# Patient Record
Sex: Male | Born: 1955 | Race: White | Hispanic: No | Marital: Married | Smoking: Former smoker
Health system: Southern US, Community
[De-identification: ages and names within clinical notes are randomized; demographics above are authoritative.]

## PROBLEM LIST (undated history)

## (undated) DIAGNOSIS — I219 Acute myocardial infarction, unspecified: Secondary | ICD-10-CM

## (undated) DIAGNOSIS — I10 Essential (primary) hypertension: Secondary | ICD-10-CM

## (undated) DIAGNOSIS — E119 Type 2 diabetes mellitus without complications: Secondary | ICD-10-CM

## (undated) DIAGNOSIS — I509 Heart failure, unspecified: Secondary | ICD-10-CM

## (undated) HISTORY — PX: CARDIAC SURGERY: SHX584

---

## 2004-05-20 ENCOUNTER — Emergency Department: Payer: Self-pay | Admitting: Emergency Medicine

## 2004-07-03 ENCOUNTER — Emergency Department: Payer: Self-pay | Admitting: Emergency Medicine

## 2004-08-03 ENCOUNTER — Ambulatory Visit (HOSPITAL_COMMUNITY): Admission: RE | Admit: 2004-08-03 | Discharge: 2004-08-03 | Payer: Self-pay | Admitting: Orthopedic Surgery

## 2006-03-30 IMAGING — CR DG CHEST 2V
2 series · 2 of 2 positions shown · non-contrast
Comparison: No comparisons.

CLINICAL DATA: 48-year-old male, left thumb infection.  History of smoking and hypertension.  Preoperative respiratory evaluation.  
 2 VIEW CHEST RADIOGRAPH:

[view not recorded (1 of 2)]
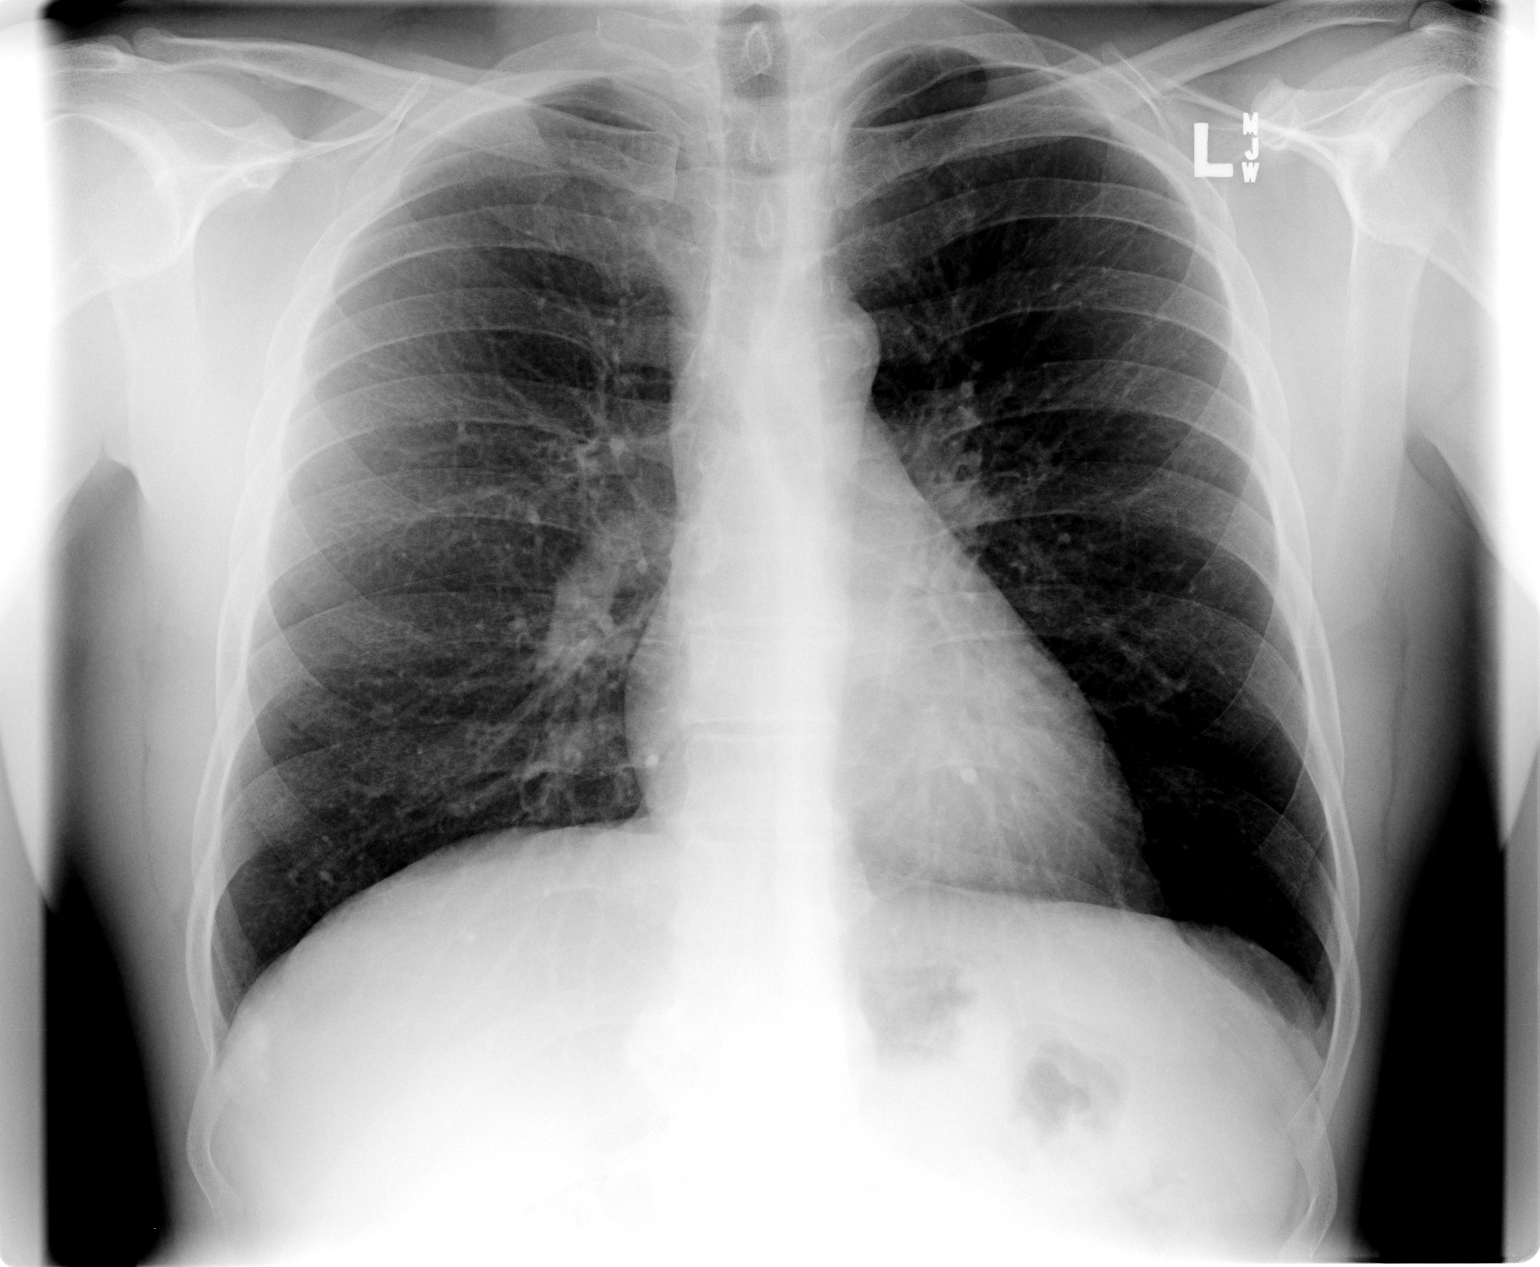

[view not recorded (2 of 2)]
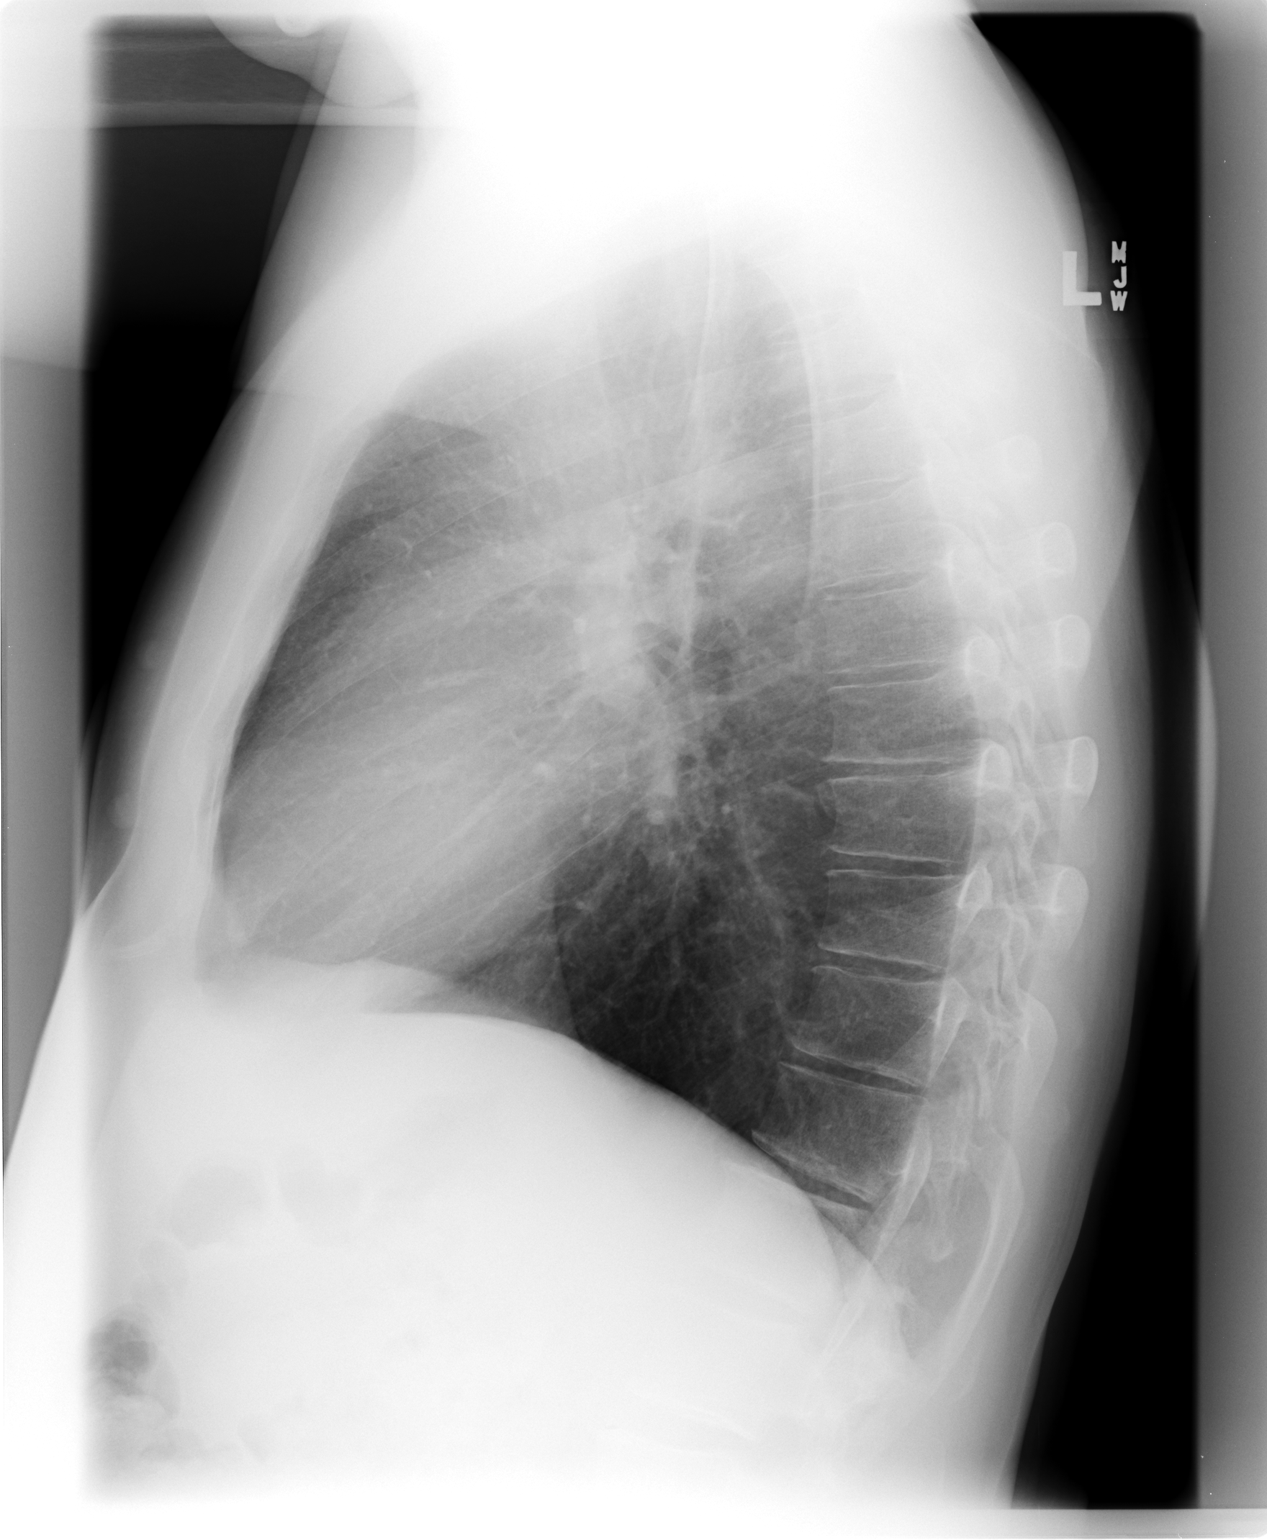

[2 of 2 positions shown; findings below may reference images not displayed]

FINDINGS: Mild increased peribronchial changes and interstitial markings throughout the lungs without acute consolidation, pneumonia, effusion, or pneumothorax.  Heart size is normal.
IMPRESSION: Chronic peribronchial changes and interstitial prominence.  No acute chest disease.

## 2010-07-24 DIAGNOSIS — I10 Essential (primary) hypertension: Secondary | ICD-10-CM | POA: Insufficient documentation

## 2010-07-24 DIAGNOSIS — G250 Essential tremor: Secondary | ICD-10-CM | POA: Insufficient documentation

## 2010-10-01 ENCOUNTER — Inpatient Hospital Stay: Payer: Self-pay | Admitting: Internal Medicine

## 2010-10-10 ENCOUNTER — Ambulatory Visit: Payer: Self-pay | Admitting: Internal Medicine

## 2010-10-10 ENCOUNTER — Observation Stay: Payer: Self-pay | Admitting: Internal Medicine

## 2010-10-21 ENCOUNTER — Ambulatory Visit: Payer: Self-pay | Admitting: Internal Medicine

## 2010-11-13 ENCOUNTER — Emergency Department: Payer: Self-pay | Admitting: Emergency Medicine

## 2011-01-09 ENCOUNTER — Other Ambulatory Visit: Payer: Self-pay | Admitting: Internal Medicine

## 2012-06-06 IMAGING — CR DG CHEST 1V PORT
1 series · 1 of 1 positions shown · non-contrast
Comparison: none

REASON FOR EXAM: Shortness of Breath
COMMENTS:

[view not recorded]
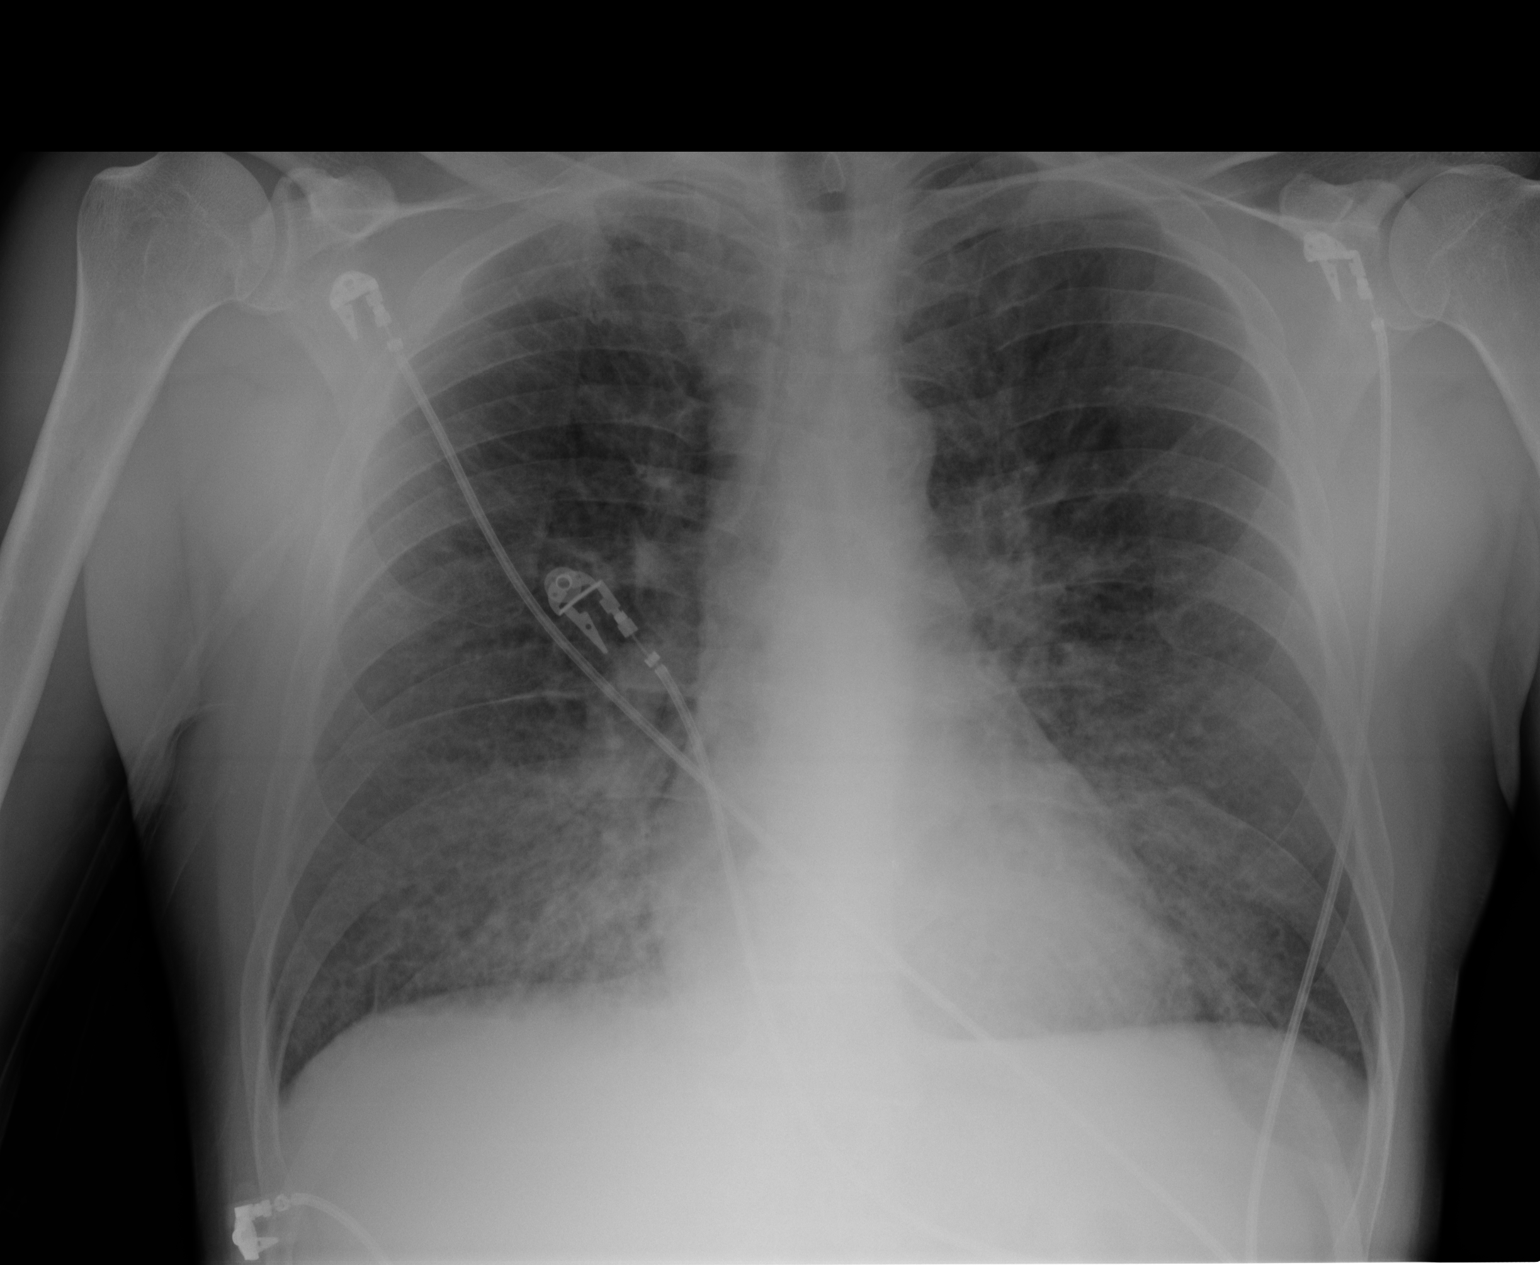

[1 of 1 positions shown; findings below may reference images not displayed]

PROCEDURE:     DXR - DXR PORTABLE CHEST SINGLE VIEW  - October 10, 2010 [DATE]

RESULT:     Is patchy increased density in the mid and lower lung zones
bilaterally suspicious for bilateral infiltrates versus edema. The heart is
normal in size. There is no effusion or complete consolidation. Monitoring
electrodes are present.
IMPRESSION: Patchy basilar and midlung infiltrates versus pulmonary
edema. Followup PA and lateral views would be helpful.

## 2013-05-09 ENCOUNTER — Inpatient Hospital Stay: Payer: Self-pay | Admitting: Internal Medicine

## 2013-05-09 LAB — MAGNESIUM: Magnesium: 1.6 mg/dL — ABNORMAL LOW

## 2013-05-09 LAB — COMPREHENSIVE METABOLIC PANEL
ALBUMIN: 3.1 g/dL — AB (ref 3.4–5.0)
ALT: 28 U/L (ref 12–78)
ANION GAP: 5 — AB (ref 7–16)
AST: 29 U/L (ref 15–37)
Alkaline Phosphatase: 165 U/L — ABNORMAL HIGH
BUN: 15 mg/dL (ref 7–18)
Bilirubin,Total: 0.5 mg/dL (ref 0.2–1.0)
CREATININE: 1.35 mg/dL — AB (ref 0.60–1.30)
Calcium, Total: 8.5 mg/dL (ref 8.5–10.1)
Chloride: 104 mmol/L (ref 98–107)
Co2: 27 mmol/L (ref 21–32)
EGFR (African American): >60
EGFR (Non-African Amer.): 57 — ABNORMAL LOW
GLUCOSE: 239 mg/dL — AB (ref 65–99)
OSMOLALITY: 281 (ref 275–301)
POTASSIUM: 4.2 mmol/L (ref 3.5–5.1)
SODIUM: 136 mmol/L (ref 136–145)
Total Protein: 7.7 g/dL (ref 6.4–8.2)

## 2013-05-09 LAB — CBC
HCT: 43 % (ref 40.0–52.0)
HGB: 13.4 g/dL (ref 13.0–18.0)
MCH: 26 pg (ref 26.0–34.0)
MCHC: 31.3 g/dL — ABNORMAL LOW (ref 32.0–36.0)
MCV: 83 fL (ref 80–100)
PLATELETS: 507 10*3/uL — AB (ref 150–440)
RBC: 5.17 10*6/uL (ref 4.40–5.90)
RDW: 16.9 % — ABNORMAL HIGH (ref 11.5–14.5)
WBC: 13.9 10*3/uL — ABNORMAL HIGH (ref 3.8–10.6)

## 2013-05-09 LAB — PRO B NATRIURETIC PEPTIDE: B-Type Natriuretic Peptide: 1034 pg/mL — ABNORMAL HIGH (ref 0–125)

## 2013-05-09 LAB — CK TOTAL AND CKMB (NOT AT ARMC)
CK, TOTAL: 195 U/L (ref 35–232)
CK-MB: 3.3 ng/mL (ref 0.5–3.6)

## 2013-05-09 LAB — TROPONIN I: Troponin-I: <0.02 ng/mL

## 2013-05-09 LAB — APTT: ACTIVATED PTT: 23 s — AB (ref 23.6–35.9)

## 2013-05-09 LAB — PROTIME-INR
INR: 1
PROTHROMBIN TIME: 13.4 s (ref 11.5–14.7)

## 2013-05-09 LAB — LIPASE, BLOOD: Lipase: 187 U/L (ref 73–393)

## 2013-05-10 LAB — TROPONIN I
TROPONIN-I: 0.13 ng/mL — AB
Troponin-I: 0.11 ng/mL — ABNORMAL HIGH

## 2013-05-10 LAB — CK-MB
CK-MB: 3.5 ng/mL (ref 0.5–3.6)
CK-MB: 3.9 ng/mL — AB (ref 0.5–3.6)

## 2013-05-11 LAB — COMPREHENSIVE METABOLIC PANEL
ALT: 25 U/L (ref 12–78)
Albumin: 3 g/dL — ABNORMAL LOW (ref 3.4–5.0)
Alkaline Phosphatase: 97 U/L
Anion Gap: 18 — ABNORMAL HIGH (ref 7–16)
BILIRUBIN TOTAL: 0.5 mg/dL (ref 0.2–1.0)
BUN: 26 mg/dL — ABNORMAL HIGH (ref 7–18)
CALCIUM: 8.8 mg/dL (ref 8.5–10.1)
Chloride: 101 mmol/L (ref 98–107)
Co2: 17 mmol/L — ABNORMAL LOW (ref 21–32)
Creatinine: 1.13 mg/dL (ref 0.60–1.30)
Glucose: 127 mg/dL — ABNORMAL HIGH (ref 65–99)
OSMOLALITY: 278 (ref 275–301)
POTASSIUM: 3.4 mmol/L — AB (ref 3.5–5.1)
SGOT(AST): 32 U/L (ref 15–37)
SODIUM: 136 mmol/L (ref 136–145)
TOTAL PROTEIN: 6.7 g/dL (ref 6.4–8.2)

## 2013-05-11 LAB — CBC WITH DIFFERENTIAL/PLATELET
BASOS ABS: 0 10*3/uL (ref 0.0–0.1)
BASOS PCT: 0.2 %
EOS PCT: 0 %
Eosinophil #: 0 10*3/uL (ref 0.0–0.7)
HCT: 34.5 % — ABNORMAL LOW (ref 40.0–52.0)
HGB: 11.1 g/dL — ABNORMAL LOW (ref 13.0–18.0)
Lymphocyte #: 2.1 10*3/uL (ref 1.0–3.6)
Lymphocyte %: 15.4 %
MCH: 25.8 pg — AB (ref 26.0–34.0)
MCHC: 32.3 g/dL (ref 32.0–36.0)
MCV: 80 fL (ref 80–100)
Monocyte #: 0.9 x10 3/mm (ref 0.2–1.0)
Monocyte %: 6.6 %
NEUTROS PCT: 77.8 %
Neutrophil #: 10.7 10*3/uL — ABNORMAL HIGH (ref 1.4–6.5)
PLATELETS: 284 10*3/uL (ref 150–440)
RBC: 4.31 10*6/uL — ABNORMAL LOW (ref 4.40–5.90)
RDW: 16.6 % — ABNORMAL HIGH (ref 11.5–14.5)
WBC: 13.8 10*3/uL — AB (ref 3.8–10.6)

## 2013-05-11 LAB — LIPID PANEL
Cholesterol: 138 mg/dL (ref 0–200)
HDL Cholesterol: 46 mg/dL (ref 40–60)
Ldl Cholesterol, Calc: 77 mg/dL (ref 0–100)
Triglycerides: 73 mg/dL (ref 0–200)
VLDL Cholesterol, Calc: 15 mg/dL (ref 5–40)

## 2013-05-14 LAB — CULTURE, BLOOD (SINGLE)

## 2014-05-10 DIAGNOSIS — E119 Type 2 diabetes mellitus without complications: Secondary | ICD-10-CM | POA: Insufficient documentation

## 2014-07-29 NOTE — Consult Note (Signed)
PATIENT NAME:  Richard [name], Richard [name] MR#:  [id number] DATE OF BIRTH:  [birth year]  DATE OF CONSULTATION:  05/10/2013  REFERRING PHYSICIAN:   CONSULTING PHYSICIAN:  Laurier NancyShaukat A. Lavone Barrientes, MD  INDICATION FOR CONSULTATION: Congestive heart failure and shortness of breath.   HISTORY OF PRESENT ILLNESS: This is a 59 year old white male with a past medical history of coronary artery disease, status post PCI and stenting twice when he had MI in June 2012, first at [facility] and then [facility]. He presented at that time also with ejection fraction of 45% and had congestive heart failure. Right now, he was doing fine until a couple of weeks ago, he started feeling short of breath. He thought he had flulike symptoms, but yesterday at 8:00 all of a sudden he had severe shortness of breath with no chest pain and thus was brought to the Emergency Room. He was also producing a productive cough with yellow sputum.   PAST MEDICAL HISTORY: History of coronary artery disease, hypertension, history of hernia repair, history of diet-controlled diabetes, hyperlipidemia and renal insufficiency.   MEDICATIONS: Aspirin 81 mg, Coreg 12.5 b.i.d., lisinopril 2.5, Effient 10 mg. omeprazole 20 mg, Lipitor 80 mg and Lasix 20 mg.   FAMILY HISTORY: Positive for coronary artery disease.   SOCIAL HISTORY: He quit smoking in 2012.   ALLERGIES: None.   PHYSICAL EXAMINATION:  GENERAL: He is alert, oriented x 3, in no acute distress.  VITAL SIGNS: Stable.  NECK: No JVD.  LUNGS: Clear.  HEART: Regular rate and rhythm. Normal S1, S2. No audible murmur.  ABDOMEN: Soft, nontender, positive bowel sounds.  EXTREMITIES: No pedal edema.   EKG showed sinus rhythm, left atrial enlargement, nonspecific ST-T changes. His BNP was 1034. BUN and creatinine are 15/1.35. Troponin first one was negative, but the second one came back 0.13. Chest x-ray shows pulmonary edema versus changes secondary to multifocal pneumonia.    ASSESSMENT AND PLAN: The patient has congestive heart failure, mildly elevated troponin with history of coronary artery disease. He is being treated for pneumonia as well as congestive heart failure. Advise repeating an echocardiogram and agree with current treatment of giving Lasix 40 b.i.d. intravenous and continue the rest of his medications along with heparin. We will make further recommendation after looking at the echocardiogram.   Thank you very much for the referral.  ____________________________ Laurier NancyShaukat A. Kailana Benninger, MD sak:aw D: 05/10/2013 08:37:02 ET T: 05/10/2013 08:48:30 ET JOB#: [id number]  cc: Laurier NancyShaukat A. Taria Castrillo, MD, <Dictator> Laurier NancySHAUKAT A Torry Istre MD ELECTRONICALLY SIGNED 05/20/2013 8:38

## 2014-07-29 NOTE — H&P (Signed)
PATIENT NAME:  Richard [name], Richard [name] MR#:  [id number] DATE OF BIRTH:  Jun 13, 1955  DATE OF ADMISSION:  05/10/2013  REFERRING PHYSICIAN: Dr. Hinda Kehr.   PRIMARY CARE PHYSICIAN AND PRIMARY CARDIOLOGIST: The patient is following at [facility] for both primary care and cardiology.   CHIEF COMPLAINT: Shortness of breath.   HISTORY OF PRESENT ILLNESS: This is a 59 year old male with significant past medical history of coronary artery disease, status post stent in June 2012 here and stent in late 2012 at [facility], as well as history of congestive heart failure with last echo in our records showing EF 45% before 2 years. The patient presents with complaints of shortness of breath for the last few days. Reports his symptoms mainly exacerbated by exertion but have been worsening recently, even started to happen at rest. He denies any lower extremity edema or any supine orthopnea. As well, denies any chest pain, any nausea, coffee-ground emesis, any palpitation or dizziness. Upon presentation, the patient was tachypneic and in acute respiratory distress requiring BiPAP. Given Imdur. The patient's chest x-ray did show extensive bilateral airspace disease most compatible with pulmonary edema, although it can be secondary to multifocal pneumonia. The patient denies any fever or chills. He has been complaining of cough with productive yellow sputum as well. As well, he has been complaining of feeling of having runny nose and having viral URI disease over the last week. Hospitalist service was requested to admit the patient for further management and treatment of his CHF and pneumonia.   PAST MEDICAL HISTORY:  1. History of coronary artery disease.  2. Hypertension.  3. History of smoking, quit in 2012.  4. History of hernia repair.  5. Impaired fasting glucose.  6. Hyperlipidemia.  7. Renal insufficiency.   HOME MEDICATIONS:  1. Aspirin 81 mg daily.  2. Coreg 12.5 mg p.o. b.i.d.  3. Lisinopril 2.5 mg oral daily.  4.  Effient 10 mg oral daily.  5. Omeprazole 20 mg oral daily.  6. Atorvastatin 80 mg oral daily.  7. Sublingual nitro as needed.  8. Lasix 20 mg oral daily.   FAMILY HISTORY: Significant for cancer in his mother and coronary artery disease in father.   SOCIAL HISTORY: The patient lives with his wife. Has 3 children. The patient quit smoking in 2012. No alcohol. No illicit drug use.   ALLERGIES: No known drug allergies.   REVIEW OF SYSTEMS:  CONSTITUTIONAL: The patient denies fever, chills. Complains of fatigue, weakness. Denies weight gain, weight loss.  EYES: Denies blurry vision, double vision, inflammation, glaucoma.  ENT: Complaints of upper respiratory infection symptoms including runny nose. Denies tinnitus, ear pain, hearing loss.  RESPIRATORY: Complains of cough productive of yellow sputum, shortness of breath. Denies history of COPD or hemoptysis.  CARDIOVASCULAR: Denies chest pain, edema, arrhythmia, palpitation, syncope.  GASTROINTESTINAL: Denies nausea, vomiting, diarrhea, abdominal pain, hematemesis.  GENITOURINARY: Denies dysuria, hematuria, renal colic.  ENDOCRINE: Denies polyuria, polydipsia, heat or cold intolerance.  HEMATOLOGY: Denies anemia, easy bruising, bleeding diathesis.  INTEGUMENTARY: Denies acne, rash or skin lesions.  MUSCULOSKELETAL: Denies any swelling, arthritis or cramps.  NEUROLOGIC: Denies CVA, TIA, tremors, vertigo.  PSYCHIATRIC: Denies anxiety, insomnia, bipolar disorder.   PHYSICAL EXAMINATION:  VITAL SIGNS: Temperature 97.9, pulse 103, respiratory rate 26, blood pressure 96/52, saturating 99% on BiPAP.  GENERAL: Well-nourished male, looks comfortable in bed, in no apparent distress on BiPAP.  HEENT: Head atraumatic, normocephalic. Pupils equal, reactive to light. Pink conjunctivae. Anicteric sclerae. Moist oral mucosa. Wearing BiPAP mask.  NECK: Has JVD + 8 cm. No carotid bruits. Supple. No thyromegaly.  CHEST: The patient has fair air entry  bilaterally with scattered rales and rhonchi. No wheezing.  CARDIOVASCULAR: S1, S2 heard. No rubs, murmurs or gallops.  ABDOMEN: Soft, nontender, nondistended. Bowel sounds present.  EXTREMITIES: No edema. No clubbing. No cyanosis. Pedal pulses +2 bilaterally.  PSYCHIATRIC: Appropriate affect. Awake, alert x 3. Intact judgment and insight.  NEUROLOGIC: Cranial nerves grossly intact. Motor 5 out of 5. No focal deficits.  LYMPHATIC: No cervical lymphadenopathy.  MUSCULOSKELETAL: No joint effusion or erythema.  SKIN: Warm and dry. Normal skin turgor.   PERTINENT LABORATORIES: BNP 1034, glucose 239, BUN 15, creatinine 1.35, sodium 136, potassium 4.2, chloride 104, CO2 27. ALT 28, AST 29, alk phos 165. Troponin less than 0.02. White blood cells 13.9, hemoglobin 13.4, hematocrit 43, platelets 507. INR is 1. PH 7.23, pCO2 of 52 and pO2 of 292.   IMAGING: Chest x-ray, portable, shows extensive bilateral airspace disease. Appears most compatible with pulmonary edema, although it could be secondary to multifocal pneumonia.   ASSESSMENT AND PLAN:  1. Acute congestive heart failure: The patient will be admitted to telemetry unit for his congestive heart failure. Will check 2-D echocardiogram to evaluate his ejection fraction. Appears to be most likely systolic congestive heart failure. Last known ejection fraction 45%. The patient will be started on IV Lasix 40 mg every 12 hours. Will give him first dose now. Will continue to cycle his cardiac enzymes and follow the trend. Will have daily weights and ins and outs. Will consult cardiology. He is already on aspirin, statin, lisinopril and beta blockers.  2. Pneumonia: Chest x-ray shows possible evidence of pneumonia. As well, with the patient's symptoms of cough with productive yellow sputum, he will start to be treated for community-acquired pneumonia. Will start him on Rocephin and Zithromax.  3. Hypertension: Blood pressure is acceptable, actually on the lower  side. This is after he received nitro paste and he is on BiPAP. Will resume him back on his home medications including Coreg and low-dose lisinopril.  4. Hyperlipidemia: Continue with statin.  5. History of coronary artery disease: Denies any chest pain. Has no EKG changes. Already on aspirin, Effient, statin and lisinopril.  6. Deep vein thrombosis prophylaxis: Will have the patient on subcutaneous heparin.  7. Code status: The patient reports he is FULL CODE.    TOTAL TIME SPENT ON ADMISSION AND PATIENT CARE: 55 minutes.   ____________________________ Albertine Patricia, MD dse:gb D: 05/10/2013 00:24:43 ET T: 05/10/2013 05:38:37 ET JOB#: [id number]  cc: Albertine Patricia, MD, <Dictator> Zebulen Simonis Graciela Husbands MD ELECTRONICALLY SIGNED 05/11/2013 3:54

## 2014-07-29 NOTE — Discharge Summary (Signed)
PATIENT NAME:  Richard [name], Richard [name] MR#:  [name] DATE OF BIRTH:  18-Apr-1955  DATE OF ADMISSION:  05/09/2013 DATE OF DISCHARGE:  05/11/2013  ADMITTING PHYSICIAN: Starleen Armsawood S. Elgergawy, MD  DISCHARGING PHYSICIAN: Enid Baasadhika Lysander Calixte, MD  [facility] At [facility].   PRIMARY CARDIOLOGIST: At [facility] as well.  CONSULTATIONS IN THE HOSPITAL: Cardiology consultation by Dr. Adrian BlackwaterShaukat Khan.   DISCHARGE DIAGNOSES: 1.  Acute  respiratory failure.  2.  Acute on chronic congestive heart failure, both systolic and diastolic dysfunction, ejection fraction around 45%.  3.  Coronary artery disease, status post left anterior descending stent.  4.  Elevated troponin secondary to demand ischemia.  5.  Acute bronchitis.   DISCHARGE HOME MEDICATIONS:  1.  Sublingual nitroglycerin 0.4 mg p.r.n. for chest pain.  2.  Lisinopril 5 mg p.o. daily.  3.  Aspirin 81 mg p.o. daily. 4.  Effient 10 mg p.o. daily.  5.  Coreg 12.5 mg p.o. b.i.d.  6.  Lipitor 80 mg p.o. at bedtime.  7.  Lasix 40 mg p.o. daily.  8.  Combivent Respimat 1 puff 4 times a day.  9.  Levaquin 500 mg p.o. daily for 3 more days.  10.  Potassium chloride 20 mEq p.o. daily.   DISCHARGE DIET: Low-sodium diet.   DISCHARGE ACTIVITY: As tolerated.   DISCHARGE FOLLOWUP INSTRUCTIONS:  1.  [facility].  2.  PCP followup in 2 weeks.   LABORATORY AND IMAGING STUDIES PRIOR TO DISCHARGE: WBC 13.8, hemoglobin 11.1, hematocrit 34.5, platelet count 284.   Sodium 136, potassium 3.4, chloride 101, bicarbonate 17, BUN 26, creatinine 1.1, glucose 127 and calcium of 8.8.   ALT 25, AST 32, alkaline phosphatase 97, total bilirubin 0.5 and albumin of 3.0.   LDL 77, HDL 46, total cholesterol 295158 and triglycerides 73.   Echo Doppler showing LV ejection fraction is 40% to 45%, impaired relaxation; diffuse hypokinesis is also noted.  Chest x-ray after admission showing resolved pulmonary edema with clear lung fields, and  minor opacities in the posterior lung bases could be atelectasis.   Troponins were borderline elevated at 0.11 and 0.13.   His initial ABG on admission showing pH of 7.23, pCO2 of 52, Po2 was 292 with bicarbonate of 22 and 98% sats on 100% FiO2 on the BiPAP.   Blood cultures are negative.    BRIEF HOSPITAL COURSE: Richard [name] is a pleasant 59 year old Caucasian male with past medical history significant for systolic CHF, last known EF of around 45%, coronary artery disease, status post LAD stent in the past, presents to the hospital secondary to worsening shortness of breath.  1.  Acute respiratory failure secondary to acute on chronic congestive heart failure with combined systolic and diastolic dysfunction. The patient was not on Lasix every day at home. He was told to take Lasix as needed based on weight gain. At this time, he has not noticed any increase in his weight gain. No pedal edema, however, was having worsening dyspnea on exertion. He went to urgent care walk-in clinic and was prescribed medicine for bronchitis and cough medicine with his symptoms. However, with no improvement in his symptoms, comes to the ER and chest x-ray shows significant pulmonary edema. He was diuresed with twice a day Lasix IV. That improved his lung function significantly. He was on BiPAP when he presented and weaned down to 4 liters and currently ambulating well on room air and his sats are 99%. Followup chest x-ray shows clearing  of his pulmonary edema. He is being discharged on daily Lasix along with potassium supplements. He is also on aspirin, Coreg, lisinopril, statin for his heart disease. He will follow up with his cardiologist as an outpatient next week. He was seen by Dr. Adrian Blackwater while in the hospital, and echo showed similar findings to his previous echo in the past.  2.  Acute bronchitis. The patient was started on antibiotics for his bronchitis symptoms. Cough seemed to be improving. He is also started  on Respimat inhaler and will finish off Levaquin by taking 3 more days.  3.  Coronary artery disease, status post LAD stent. The patient is on aspirin, Effient, Coreg, statin, lisinopril, and he is stable at this time.  4.  His course has been otherwise uneventful in the hospital.   DISCHARGE CONDITION: Stable.   DISCHARGE DISPOSITION: Home.   TIME SPENT ON DISCHARGE: 45 minutes. The patient was given strict CHF education prior to discharge.   ____________________________ Enid Baas, MD rk:jcm D: 05/11/2013 13:59:14 ET T: 05/11/2013 14:34:50 ET JOB#: [id number]  cc: Enid Baas, MD, <Dictator> Enid Baas MD ELECTRONICALLY SIGNED 05/12/2013 14:59

## 2014-12-27 DIAGNOSIS — J449 Chronic obstructive pulmonary disease, unspecified: Secondary | ICD-10-CM | POA: Insufficient documentation

## 2018-03-29 DIAGNOSIS — I25110 Atherosclerotic heart disease of native coronary artery with unstable angina pectoris: Secondary | ICD-10-CM | POA: Insufficient documentation

## 2021-04-19 ENCOUNTER — Other Ambulatory Visit: Payer: Self-pay

## 2021-04-19 ENCOUNTER — Emergency Department: Payer: Medicare Other

## 2021-04-19 ENCOUNTER — Inpatient Hospital Stay
Admission: EM | Admit: 2021-04-19 | Discharge: 2021-04-21 | DRG: 291 | Disposition: A | Discharge status: Home / Self Care | Payer: Medicare Other | Attending: Internal Medicine | Admitting: Internal Medicine

## 2021-04-19 DIAGNOSIS — J449 Chronic obstructive pulmonary disease, unspecified: Secondary | ICD-10-CM | POA: Diagnosis present

## 2021-04-19 DIAGNOSIS — I2510 Atherosclerotic heart disease of native coronary artery without angina pectoris: Secondary | ICD-10-CM | POA: Diagnosis present

## 2021-04-19 DIAGNOSIS — E1165 Type 2 diabetes mellitus with hyperglycemia: Secondary | ICD-10-CM | POA: Diagnosis present

## 2021-04-19 DIAGNOSIS — Z8249 Family history of ischemic heart disease and other diseases of the circulatory system: Secondary | ICD-10-CM

## 2021-04-19 DIAGNOSIS — I110 Hypertensive heart disease with heart failure: Secondary | ICD-10-CM | POA: Diagnosis present

## 2021-04-19 DIAGNOSIS — Z87891 Personal history of nicotine dependence: Secondary | ICD-10-CM | POA: Diagnosis not present

## 2021-04-19 DIAGNOSIS — Z20822 Contact with and (suspected) exposure to covid-19: Secondary | ICD-10-CM | POA: Diagnosis present

## 2021-04-19 DIAGNOSIS — I252 Old myocardial infarction: Secondary | ICD-10-CM | POA: Diagnosis not present

## 2021-04-19 DIAGNOSIS — R0602 Shortness of breath: Secondary | ICD-10-CM

## 2021-04-19 DIAGNOSIS — J810 Acute pulmonary edema: Secondary | ICD-10-CM

## 2021-04-19 DIAGNOSIS — E876 Hypokalemia: Secondary | ICD-10-CM | POA: Diagnosis present

## 2021-04-19 DIAGNOSIS — I509 Heart failure, unspecified: Secondary | ICD-10-CM

## 2021-04-19 DIAGNOSIS — I5043 Acute on chronic combined systolic (congestive) and diastolic (congestive) heart failure: Secondary | ICD-10-CM | POA: Diagnosis present

## 2021-04-19 DIAGNOSIS — Z955 Presence of coronary angioplasty implant and graft: Secondary | ICD-10-CM

## 2021-04-19 DIAGNOSIS — Z888 Allergy status to other drugs, medicaments and biological substances status: Secondary | ICD-10-CM | POA: Diagnosis not present

## 2021-04-19 DIAGNOSIS — J9601 Acute respiratory failure with hypoxia: Secondary | ICD-10-CM

## 2021-04-19 DIAGNOSIS — I5041 Acute combined systolic (congestive) and diastolic (congestive) heart failure: Secondary | ICD-10-CM | POA: Diagnosis not present

## 2021-04-19 DIAGNOSIS — E785 Hyperlipidemia, unspecified: Secondary | ICD-10-CM | POA: Diagnosis present

## 2021-04-19 DIAGNOSIS — I5033 Acute on chronic diastolic (congestive) heart failure: Secondary | ICD-10-CM | POA: Diagnosis not present

## 2021-04-19 DIAGNOSIS — I160 Hypertensive urgency: Secondary | ICD-10-CM | POA: Diagnosis present

## 2021-04-19 HISTORY — DX: Essential (primary) hypertension: I10

## 2021-04-19 HISTORY — DX: Heart failure, unspecified: I50.9

## 2021-04-19 HISTORY — DX: Acute myocardial infarction, unspecified: I21.9

## 2021-04-19 HISTORY — DX: Type 2 diabetes mellitus without complications: E11.9

## 2021-04-19 LAB — COMPREHENSIVE METABOLIC PANEL
ALT: 19 U/L (ref 0–44)
AST: 24 U/L (ref 15–41)
Albumin: 3.6 g/dL (ref 3.5–5.0)
Alkaline Phosphatase: 110 U/L (ref 38–126)
Anion gap: 8 (ref 5–15)
BUN: 16 mg/dL (ref 8–23)
CO2: 27 mmol/L (ref 22–32)
Calcium: 9.2 mg/dL (ref 8.9–10.3)
Chloride: 105 mmol/L (ref 98–111)
Creatinine, Ser: 1.25 mg/dL — ABNORMAL HIGH (ref 0.61–1.24)
GFR, Estimated: >60 mL/min (ref >60)
Glucose, Bld: 222 mg/dL — ABNORMAL HIGH (ref 70–99)
Potassium: 4.4 mmol/L (ref 3.5–5.1)
Sodium: 140 mmol/L (ref 135–145)
Total Bilirubin: 0.8 mg/dL (ref 0.3–1.2)
Total Protein: 7.2 g/dL (ref 6.5–8.1)

## 2021-04-19 LAB — TROPONIN I (HIGH SENSITIVITY): Troponin I (High Sensitivity): 15 ng/L (ref <18)

## 2021-04-19 LAB — CBC WITH DIFFERENTIAL/PLATELET
Abs Immature Granulocytes: 0.04 10*3/uL (ref 0.00–0.07)
Basophils Absolute: 0 10*3/uL (ref 0.0–0.1)
Basophils Relative: 0 %
Eosinophils Absolute: 0.3 10*3/uL (ref 0.0–0.5)
Eosinophils Relative: 2 %
HCT: 45.9 % (ref 39.0–52.0)
Hemoglobin: 14.2 g/dL (ref 13.0–17.0)
Immature Granulocytes: 0 %
Lymphocytes Relative: 23 %
Lymphs Abs: 2.5 10*3/uL (ref 0.7–4.0)
MCH: 27.6 pg (ref 26.0–34.0)
MCHC: 30.9 g/dL (ref 30.0–36.0)
MCV: 89.3 fL (ref 80.0–100.0)
Monocytes Absolute: 0.6 10*3/uL (ref 0.1–1.0)
Monocytes Relative: 6 %
Neutro Abs: 7.4 10*3/uL (ref 1.7–7.7)
Neutrophils Relative %: 69 %
Platelets: 264 10*3/uL (ref 150–400)
RBC: 5.14 MIL/uL (ref 4.22–5.81)
RDW: 14.6 % (ref 11.5–15.5)
WBC: 10.9 10*3/uL — ABNORMAL HIGH (ref 4.0–10.5)
nRBC: 0 % (ref 0.0–0.2)

## 2021-04-19 LAB — RESP PANEL BY RT-PCR (FLU A&B, COVID) ARPGX2
Influenza A by PCR: NEGATIVE
Influenza B by PCR: NEGATIVE
SARS Coronavirus 2 by RT PCR: NEGATIVE

## 2021-04-19 MED ORDER — ACETAMINOPHEN 650 MG RE SUPP: 650.0000 mg | Freq: Four times a day (QID) | RECTAL | Status: DC | PRN

## 2021-04-19 MED ORDER — ENOXAPARIN SODIUM 40 MG/0.4ML IJ SOSY
40.0000 mg | PREFILLED_SYRINGE | INTRAMUSCULAR | Status: DC
  Administered 2021-04-20 – 2021-04-21 (×2): 40 mg via SUBCUTANEOUS
  Filled 2021-04-19 (×2): qty 0.4

## 2021-04-19 MED ORDER — FUROSEMIDE 10 MG/ML IJ SOLN: 60.0000 mg | Freq: Two times a day (BID) | INTRAMUSCULAR | Status: DC

## 2021-04-19 MED ORDER — FUROSEMIDE 10 MG/ML IJ SOLN
40.0000 mg | Freq: Two times a day (BID) | INTRAMUSCULAR | Status: DC
  Administered 2021-04-20: 40 mg via INTRAVENOUS
  Filled 2021-04-19: qty 4

## 2021-04-19 MED ORDER — TRAZODONE HCL 50 MG PO TABS: 25.0000 mg | ORAL_TABLET | Freq: Every evening | ORAL | Status: DC | PRN

## 2021-04-19 MED ORDER — ONDANSETRON HCL 4 MG PO TABS: 4.0000 mg | ORAL_TABLET | Freq: Four times a day (QID) | ORAL | Status: DC | PRN

## 2021-04-19 MED ORDER — INSULIN ASPART 100 UNIT/ML IJ SOLN: 0.0000 [IU] | Freq: Three times a day (TID) | INTRAMUSCULAR | Status: DC

## 2021-04-19 MED ORDER — MAGNESIUM HYDROXIDE 400 MG/5ML PO SUSP
30.0000 mL | Freq: Every day | ORAL | Status: DC | PRN
  Filled 2021-04-19: qty 30

## 2021-04-19 MED ORDER — ONDANSETRON HCL 4 MG/2ML IJ SOLN: 4.0000 mg | Freq: Four times a day (QID) | INTRAMUSCULAR | Status: DC | PRN

## 2021-04-19 MED ORDER — FUROSEMIDE 10 MG/ML IJ SOLN
40.0000 mg | Freq: Once | INTRAMUSCULAR | Status: AC
  Administered 2021-04-19: 40 mg via INTRAVENOUS
  Filled 2021-04-19: qty 4

## 2021-04-19 MED ORDER — ACETAMINOPHEN 325 MG PO TABS: 650.0000 mg | ORAL_TABLET | Freq: Four times a day (QID) | ORAL | Status: DC | PRN

## 2021-04-19 NOTE — ED Provider Notes (Signed)
[facility] Provider Note    Event Date/Time   First MD Initiated Contact with Patient 04/19/21 2104     (approximate)   History   Respiratory Distress   HPI Richard [name] is a 66 y.o. male with a stated past medical history of CHF and COPD who presents via EMS for respiratory distress that began approximately 24 hours prior to arrival.  Patient states that he has been not been taking his Lasix recently and endorses this feeling like shortness of breath from his CHF.  Patient denies any lower extremity edema or increase in fluid intake.  Patient denies any chest pain associated with the shortness of breath.     Physical Exam   Triage Vital Signs: ED Triage Vitals  Enc Vitals Group     BP 04/19/21 2056 (!) 210/117     Pulse Rate 04/19/21 2056 92     Resp 04/19/21 2056 (!) 27     Temp --      Temp src --      SpO2 04/19/21 2056 100 %     Weight 04/19/21 2058 151 lb (68.5 kg)     Height 04/19/21 2058 5\' 5"  (1.651 m)     Head Circumference --      Peak Flow --      Pain Score 04/19/21 2058 0     Pain Loc --      Pain Edu? --      Excl. in GC? --     Most recent vital signs: Vitals:   04/19/21 2130 04/19/21 2200  BP: (!) 162/85 (!) 146/80  Pulse: 93 95  Resp: (!) 25 (!) 22  SpO2: 100% 100%    General: Awake, no distress.  CV:  Good peripheral perfusion.  Resp:  Increased effort.  Rales over bilateral lung fields Abd:  No distention.  Other:  Patient arrives with CPAP in place via EMS   ED Results / Procedures / Treatments   Labs (all labs ordered are listed, but only abnormal results are displayed) Labs Reviewed  COMPREHENSIVE METABOLIC PANEL - Abnormal; Notable for the following components:      Result Value   Glucose, Bld 222 (*)    Creatinine, Ser 1.25 (*)    All other components within normal limits  CBC WITH DIFFERENTIAL/PLATELET - Abnormal; Notable for the following components:   WBC 10.9 (*)    All other components  within normal limits  RESP PANEL BY RT-PCR (FLU A&B, COVID) ARPGX2  BRAIN NATRIURETIC PEPTIDE  URINALYSIS, ROUTINE W REFLEX MICROSCOPIC  TROPONIN I (HIGH SENSITIVITY)     EKG ED ECG REPORT I, 04/21/21, the attending physician, personally viewed and interpreted this ECG.  Date: 04/19/2021 EKG Time: 2057 Rate: 92 Rhythm: normal sinus rhythm QRS Axis: normal Intervals: normal ST/T Wave abnormalities: normal Narrative Interpretation: no evidence of acute ischemia   RADIOLOGY ED MD interpretation: Single view portable chest x-ray shows diffuse airspace opacities bilaterally likely representing pulmonary edema  Official radiology report(s): DG Chest Portable 1 View  Result Date: 04/19/2021 CLINICAL DATA:  Shortness of breath EXAM: PORTABLE CHEST 1 VIEW COMPARISON:  05/10/2013 FINDINGS: Check shadow is within normal limits. Aortic calcifications are again seen. Lungs are well aerated bilaterally. Diffuse opacity is noted throughout both lungs likely representing a mild degree of pulmonary edema. No sizable effusion is noted. No focal confluent infiltrate is seen. No bony abnormality is noted. IMPRESSION: Diffuse airspace opacity bilaterally likely representing mild pulmonary edema.  No other focal abnormality is seen. Electronically Signed   By: Alcide Clever M.D.   On: 04/19/2021 21:12      PROCEDURES:  Critical Care performed: Yes, see critical care procedure note(s)  .1-3 Lead EKG Interpretation Performed by: Merwyn Katos, MD Authorized by: Merwyn Katos, MD     Interpretation: normal     ECG rate:  94   ECG rate assessment: normal     Rhythm: sinus rhythm     Ectopy: none     Conduction: normal    CRITICAL CARE Performed by: Merwyn Katos   Total critical care time: 29 minutes  Critical care time was exclusive of separately billable procedures and treating other patients.  Critical care was necessary to treat or prevent imminent or life-threatening  deterioration.  Critical care was time spent personally by me on the following activities: development of treatment plan with patient and/or surrogate as well as nursing, discussions with consultants, evaluation of patient's response to treatment, examination of patient, obtaining history from patient or surrogate, ordering and performing treatments and interventions, ordering and review of laboratory studies, ordering and review of radiographic studies, pulse oximetry and re-evaluation of patient's condition.   MEDICATIONS ORDERED IN ED: Medications  furosemide (LASIX) injection 40 mg (40 mg Intravenous Given 04/19/21 2156)     IMPRESSION / MDM / ASSESSMENT AND PLAN / ED COURSE  I reviewed the triage vital signs and the nursing notes.                              Differential diagnosis includes, but is not limited to, CHF exacerbation with pulmonary edema, ACS, COPD exacerbation with hypoxia, sepsis, pneumonia, PE  The patient is on the cardiac monitor to evaluate for evidence of arrhythmia and/or significant heart rate changes.  Endorses dyspnea denies LE edema Endorses Non adherence to medication regimen  Workup: ECG, CBC, BMP, Troponin, BNP, CXR Findings: EKG: No STEMI and no evidence of Brugadas sign, delta wave, epsilon wave, significantly prolonged QTc, or malignant arrhythmia. BNP: Pending CXR: Bilateral pulmonary edema Based on history, exam and findings, presentation most consistent with acute on chronic heart failure. Low suspicion for PNA, ACS, tamponade, aortic dissection. Interventions: Oxygen, Diuresis  Reassessment: Symptoms improved in ED with oxygen and diuresis  Disposition (Stable but not significantly improved): Admit to medicine for further monitoring and for improvement of medication regimen to control symptoms.       FINAL CLINICAL IMPRESSION(S) / ED DIAGNOSES   Final diagnoses:  Acute pulmonary edema (HCC)  Shortness of breath  Acute respiratory  failure with hypoxia (HCC)     Rx / DC Orders   ED Discharge Orders     None        Note:  This document was prepared using Dragon voice recognition software and may include unintentional dictation errors.   Merwyn Katos, MD 04/19/21 2231

## 2021-04-19 NOTE — H&P (Addendum)
[place]   PATIENT NAME: Richard LingoRichard [name]    MR#:  [id number]  DATE OF BIRTH:  [birth year]  DATE OF ADMISSION:  04/19/2021  PRIMARY CARE PHYSICIAN: No primary care provider on file.   Patient is coming from: Home  REQUESTING/REFERRING PHYSICIAN: Donna BernardBradler, Evan, MD  CHIEF COMPLAINT:   Chief Complaint  Patient presents with   Respiratory Distress    HISTORY OF PRESENT ILLNESS:  Richard GaussRichard L [name] is a 66 y.o. Caucasian male with medical history significant for CHF, type 2 diabetes mellitus, hypertension coronary artery disease, who presented to the emergency room with acute onset of worsening dyspnea with associated cough productive of clear sputum as well as wheezing with no lower extremity edema.  He denies any fever or chills.  No nausea vomiting or abdominal pain.  No chest pain or palpitations.  ED Course: When he came to the ER BP was 210/117 with respiratory rate of 27 and pulse continues 100% on BiPAP.  Labs revealed hyperglycemia of 222 and creatinine 1.25 slightly above previous levels.  BNP was 241.4 and high-sensitivity troponin I was 20 and later 15.  CBC showed Total of 10.9.  Influenza antigens and COVID-19 PCR came back negative.  UA showed rare bacteria and was otherwise unremarkable. EKG as reviewed by me : EKG showed sinus rhythm with a rate of 92 with probable left atrial enlargement and repolarization abnormality. Imaging: Chest x-ray showed diffuse airspace opacities bilaterally likely presenting mild pulmonary edema.  The patient was given 40 mg IV Lasix and continued on BiPAP.  He will be admitted to a PCU bed for further evaluation and management PAST MEDICAL HISTORY:   Past Medical History:  Diagnosis Date   CHF (congestive heart failure) (HCC)    Diabetes mellitus without complication (HCC)    Hypertension    MI (myocardial infarction) (HCC)     PAST SURGICAL HISTORY:   Past Surgical History:  Procedure Laterality Date   CARDIAC SURGERY       SOCIAL HISTORY:   Social History   Tobacco Use   Smoking status: Former    Types: Cigarettes   Smokeless tobacco: Never  Substance Use Topics   Alcohol use: Not Currently    FAMILY HISTORY:   Positive for coronary artery disease in his father.  His brother had stomach cancer.  DRUG ALLERGIES:   Allergies  Allergen Reactions   Benzodiazepines Other (See Comments)    Unknown    REVIEW OF SYSTEMS:   ROS As per history of present illness. All pertinent systems were reviewed above. Constitutional, HEENT, cardiovascular, respiratory, GI, GU, musculoskeletal, neuro, psychiatric, endocrine, integumentary and hematologic systems were reviewed and are otherwise negative/unremarkable except for positive findings mentioned above in the HPI.   MEDICATIONS AT HOME:   Prior to Admission medications   Not on File      VITAL SIGNS:  Blood pressure (!) 146/80, pulse 95, resp. rate (!) 22, height 5\' 5"  (1.651 m), weight 68.5 kg, SpO2 100 %.  PHYSICAL EXAMINATION:  Physical Exam  GENERAL: Acutely ill 66 y.o.-year-old Caucasian male patient lying in the bed with mild respiratory distress with conversational dyspnea. EYES: Pupils equal, round, reactive to light and accommodation. No scleral icterus. Extraocular muscles intact.  HEENT: Head atraumatic, normocephalic. Oropharynx and nasopharynx clear.  NECK:  Supple, no jugular venous distention. No thyroid enlargement, no tenderness.  LUNGS: Diminished bibasilar breath sounds with bibasal rales.   CARDIOVASCULAR: Regular rate and rhythm, S1, S2 normal. No murmurs,  rubs, or gallops.  ABDOMEN: Soft, nondistended, nontender. Bowel sounds present. No organomegaly or mass.  EXTREMITIES: Trace to minimal lower leg pitting edema without cyanosis, or clubbing.  NEUROLOGIC: Cranial nerves II through XII are intact. Muscle strength 5/5 in all extremities. Sensation intact. Gait not checked.  PSYCHIATRIC: The patient is alert and oriented x 3.   Normal affect and good eye contact. SKIN: No obvious rash, lesion, or ulcer.   LABORATORY PANEL:   CBC Recent Labs  Lab 04/19/21 2054  WBC 10.9*  HGB 14.2  HCT 45.9  PLT 264   ------------------------------------------------------------------------------------------------------------------  Chemistries  Recent Labs  Lab 04/19/21 2054  NA 140  K 4.4  CL 105  CO2 27  GLUCOSE 222*  BUN 16  CREATININE 1.25*  CALCIUM 9.2  AST 24  ALT 19  ALKPHOS 110  BILITOT 0.8   ------------------------------------------------------------------------------------------------------------------  Cardiac Enzymes No results for input(s): TROPONINI in the last 168 hours. ------------------------------------------------------------------------------------------------------------------  RADIOLOGY:  DG Chest Portable 1 View  Result Date: 04/19/2021 CLINICAL DATA:  Shortness of breath EXAM: PORTABLE CHEST 1 VIEW COMPARISON:  05/10/2013 FINDINGS: Check shadow is within normal limits. Aortic calcifications are again seen. Lungs are well aerated bilaterally. Diffuse opacity is noted throughout both lungs likely representing a mild degree of pulmonary edema. No sizable effusion is noted. No focal confluent infiltrate is seen. No bony abnormality is noted. IMPRESSION: Diffuse airspace opacity bilaterally likely representing mild pulmonary edema. No other focal abnormality is seen. Electronically Signed   By: Alcide Clever M.D.   On: 04/19/2021 21:12      IMPRESSION AND PLAN:  Principal Problem:   Acute CHF (congestive heart failure) (HCC)  1.  Acute hypoxic respiratory failure requiring BiPAP due to acute on chronic likely diastolic CHF. - The patient will be admitted to a PCU bed. - Continue the patient on BiPAP and taper it off as tolerated. - We will continue diuresis with IV Lasix. - We will follow serial troponins. - We will obtain a 2D echo and cardiology consult. - I notified Dr. Beatrix Fetters  about the patient.  2.  Hypertensive urgency, likely the main culprit for #1. - We will place the patient on as needed IV labetalol and resume a as Coreg and lisinopril.  3.  Dyslipidemia. - We will continue statin therapy and Zetia.  4.  Coronary artery disease. - We will continue his Effient and aspirin as well as statin therapy and beta-blocker therapy.    DVT prophylaxis: Lovenox. Code Status: full code. Family Communication:  The plan of care was discussed in details with the patient (and family). I answered all questions. The patient agreed to proceed with the above mentioned plan. Further management will depend upon hospital course. Disposition Plan: Back to previous home environment Consults called: Neurology.  All the records are reviewed and case discussed with ED provider.  Status is: Inpatient   At the time of the admission, it appears that the appropriate admission status for this patient is inpatient.  This is judged to be reasonable and necessary in order to provide the required intensity of service to ensure the patient's safety given the presenting symptoms, physical exam findings and initial radiographic and laboratory data in the context of comorbid conditions.  The patient requires inpatient status due to high intensity of service, high risk of further deterioration and high frequency of surveillance required.  I certify that at the time of admission, it is my clinical judgment that the patient will require inpatient hospital  care extending more than 2 midnights.                            Dispo: The patient is from: Home              Anticipated d/c is to: Home              Patient currently is not medically stable to d/c.              Difficult to place patient: No  Authorized and performed by: Valente David, MD Total critical care time: Approximately   60    minutes. Due to a high probability of clinically significant, life-threatening deterioration, the patient  required my highest level of preparedness to intervene emergently and I personally spent this critical care time directly and personally managing the patient.  This critical care time included obtaining a history, examining the patient, pulse oximetry, ordering and review of studies, arranging urgent treatment with development of management plan, evaluation of patient's response to treatment, frequent reassessment, and discussions with other providers. This critical care time was performed to assess and manage the high probability of imminent, life-threatening deterioration that could result in multiorgan failure.  It was exclusive of separately billable procedures and treating other patients and teaching time.    Hannah Beat M.D on 04/19/2021 at 10:50 PM  [facility]   From 7 PM-7 AM, contact night-coverage [email or web address]  CC: Primary care physician; No primary care provider on file.

## 2021-04-19 NOTE — ED Triage Notes (Addendum)
Pt arrives via [facility] from work with CC of shortness of breath that began at [employer] and gradually worsened. Upon EMS arrival, pts SpO2 was in the low 80s on room air. Pt was put on CPAP and is now 97%. Pt given 125 of Solumedrol through 18G IV in the R AC. Pt reports having nonproductive cough x1 week. Pt also reports that he takes furosemide daily but has not taken it for 3 days. Denys being out of medication or inability to get prescription filled - states he just hasn't taken it.

## 2021-04-19 NOTE — ED Notes (Signed)
Covid swab collected and sent to lab.

## 2021-04-20 ENCOUNTER — Inpatient Hospital Stay: Admit: 2021-04-20 | Payer: Medicare Other

## 2021-04-20 ENCOUNTER — Encounter: Payer: Self-pay | Admitting: Obstetrics and Gynecology

## 2021-04-20 DIAGNOSIS — I509 Heart failure, unspecified: Secondary | ICD-10-CM

## 2021-04-20 DIAGNOSIS — I5041 Acute combined systolic (congestive) and diastolic (congestive) heart failure: Secondary | ICD-10-CM

## 2021-04-20 LAB — CBC
HCT: 41.4 % (ref 39.0–52.0)
Hemoglobin: 13 g/dL (ref 13.0–17.0)
MCH: 27.6 pg (ref 26.0–34.0)
MCHC: 31.4 g/dL (ref 30.0–36.0)
MCV: 87.9 fL (ref 80.0–100.0)
Platelets: 202 10*3/uL (ref 150–400)
RBC: 4.71 MIL/uL (ref 4.22–5.81)
RDW: 14.7 % (ref 11.5–15.5)
WBC: 7.4 10*3/uL (ref 4.0–10.5)
nRBC: 0 % (ref 0.0–0.2)

## 2021-04-20 LAB — BRAIN NATRIURETIC PEPTIDE: B Natriuretic Peptide: 241.4 pg/mL — ABNORMAL HIGH (ref 0.0–100.0)

## 2021-04-20 LAB — URINALYSIS, MICROSCOPIC (REFLEX): Squamous Epithelial / HPF: NONE SEEN (ref 0–5)

## 2021-04-20 LAB — CBG MONITORING, ED
Glucose-Capillary: 181 mg/dL — ABNORMAL HIGH (ref 70–99)
Glucose-Capillary: 246 mg/dL — ABNORMAL HIGH (ref 70–99)

## 2021-04-20 LAB — BASIC METABOLIC PANEL
Anion gap: 7 (ref 5–15)
BUN: 18 mg/dL (ref 8–23)
CO2: 27 mmol/L (ref 22–32)
Calcium: 8.6 mg/dL — ABNORMAL LOW (ref 8.9–10.3)
Chloride: 103 mmol/L (ref 98–111)
Creatinine, Ser: 1.09 mg/dL (ref 0.61–1.24)
GFR, Estimated: >60 mL/min (ref >60)
Glucose, Bld: 207 mg/dL — ABNORMAL HIGH (ref 70–99)
Potassium: 3.7 mmol/L (ref 3.5–5.1)
Sodium: 137 mmol/L (ref 135–145)

## 2021-04-20 LAB — URINALYSIS, ROUTINE W REFLEX MICROSCOPIC
Bilirubin Urine: NEGATIVE
Glucose, UA: NEGATIVE mg/dL
Ketones, ur: NEGATIVE mg/dL
Leukocytes,Ua: NEGATIVE
Nitrite: NEGATIVE
Protein, ur: NEGATIVE mg/dL
Specific Gravity, Urine: 1.02 (ref 1.005–1.030)
pH: 6 (ref 5.0–8.0)

## 2021-04-20 LAB — HEMOGLOBIN A1C
Hgb A1c MFr Bld: 6.6 % — ABNORMAL HIGH (ref 4.8–5.6)
Mean Plasma Glucose: 142.72 mg/dL

## 2021-04-20 LAB — HIV ANTIBODY (ROUTINE TESTING W REFLEX): HIV Screen 4th Generation wRfx: NONREACTIVE

## 2021-04-20 LAB — TROPONIN I (HIGH SENSITIVITY): Troponin I (High Sensitivity): 20 ng/L — ABNORMAL HIGH (ref <18)

## 2021-04-20 MED ORDER — ATORVASTATIN CALCIUM 20 MG PO TABS
80.0000 mg | ORAL_TABLET | Freq: Every day | ORAL | Status: DC
  Administered 2021-04-20 – 2021-04-21 (×2): 80 mg via ORAL
  Filled 2021-04-20 (×2): qty 4

## 2021-04-20 MED ORDER — LABETALOL HCL 5 MG/ML IV SOLN: 20.0000 mg | INTRAVENOUS | Status: DC | PRN

## 2021-04-20 MED ORDER — CARVEDILOL 6.25 MG PO TABS
12.5000 mg | ORAL_TABLET | Freq: Two times a day (BID) | ORAL | Status: DC
  Administered 2021-04-20 – 2021-04-21 (×3): 12.5 mg via ORAL
  Filled 2021-04-20 (×3): qty 2

## 2021-04-20 MED ORDER — PRASUGREL HCL 10 MG PO TABS
10.0000 mg | ORAL_TABLET | Freq: Every day | ORAL | Status: DC
  Administered 2021-04-20 – 2021-04-21 (×2): 10 mg via ORAL
  Filled 2021-04-20 (×2): qty 1

## 2021-04-20 MED ORDER — FUROSEMIDE 10 MG/ML IJ SOLN
40.0000 mg | Freq: Every day | INTRAMUSCULAR | Status: DC
  Administered 2021-04-21: 40 mg via INTRAVENOUS
  Filled 2021-04-20: qty 4

## 2021-04-20 MED ORDER — EZETIMIBE 10 MG PO TABS
10.0000 mg | ORAL_TABLET | Freq: Every day | ORAL | Status: DC
  Administered 2021-04-20 – 2021-04-21 (×2): 10 mg via ORAL
  Filled 2021-04-20 (×2): qty 1

## 2021-04-20 NOTE — Progress Notes (Signed)
PROGRESS NOTE    Richard [name]  [id number] DOB: 1955/09/14 DOA: 04/19/2021 PCP: Richard [name], No  Outpatient Specialists: cardiology    Brief Narrative:   From admission h and p Richard [name] is a 66 y.o. Caucasian male with medical history significant for CHF, type 2 diabetes mellitus, hypertension coronary artery disease, who presented to the emergency room with acute onset of worsening dyspnea with associated cough productive of clear sputum as well as wheezing with no lower extremity edema.  He denies any fever or chills.  No nausea vomiting or abdominal pain.  No chest pain or palpitations.     Assessment & Plan:   Principal Problem:   Acute CHF (congestive heart failure) ([place])   # Acute respiratory failure 2/2 chf exacerbation. Presented in respiratory distress requiring bipap initially. Now breathing comfortably on room air after diuresis - monitor  # Acute combined systolic/diastolic heart failure In setting of one day missed home diuretic. Has responded to diuresis. CXR w/ pulmonary edema. Etiology is cad. Troponins flat, no chest pain - cont lasix 40 iv qd - f/u tte - cont coreg - possible d/c home after echo read  # HTN Hypertensive on arrival resolved w/ diuresis - cont diuresis, coreg - hold lisinopril while diuresing  # CAD S/p stents. Followed by [facility]. - cont home prasugrel, atorva, zetia - says didn't tolerate sglt2i as outpt  # T2DM Diet controlled per patient. Glucose here mildly elevated - f/u a1c - daily fasting    DVT prophylaxis: lovenox Code Status: full Family Communication: none at bedside  Level of care: Progressive Status is: Inpatient  Remains inpatient appropriate because: severity of illness, need for further testing        Consultants:  none  Procedures: none  Antimicrobials:  none    Subjective: This morning breathing normally, no dyspnea, no chest pain, has appetite, no fevers  Objective: Vitals:    04/20/21 0600 04/20/21 0630 04/20/21 0700 04/20/21 0730  BP: (!) 173/78 139/71 (!) 145/75 (!) 150/78  Pulse: 75 80 76 77  Resp: 19 19 19 16   Temp:      TempSrc:      SpO2: 100% 100% 98% 99%  Weight:      Height:        Intake/Output Summary (Last 24 hours) at 04/20/2021 0804 Last data filed at 04/20/2021 0740 Gross per 24 hour  Intake --  Output 2325 ml  Net -2325 ml   Filed Weights   04/19/21 2058  Weight: 68.5 kg    Examination:  General exam: Appears calm and comfortable  Respiratory system: Clear to auscultation save for bibasilar rales. Respiratory effort normal. Cardiovascular system: S1 & S2 heard, RRR. Soft systolic murmur Gastrointestinal system: Abdomen is nondistended, soft and nontender. No organomegaly or masses felt. Normal bowel sounds heard. Central nervous system: Alert and oriented. No focal neurological deficits. Extremities: Symmetric 5 x 5 power. Skin: No rashes, lesions or ulcers Psychiatry: Judgement and insight appear normal. Mood & affect appropriate.     Data Reviewed: I have personally reviewed following labs and imaging studies  CBC: Recent Labs  Lab 04/19/21 2054 04/20/21 0505  WBC 10.9* 7.4  NEUTROABS 7.4  --   HGB 14.2 13.0  HCT 45.9 41.4  MCV 89.3 87.9  PLT 264 [id number]   Basic Metabolic Panel: Recent Labs  Lab 04/19/21 2054 04/20/21 0505  NA 140 137  K 4.4 3.7  CL 105 103  CO2 27 27  GLUCOSE 222* 207*  BUN 16 18  CREATININE 1.25* 1.09  CALCIUM 9.2 8.6*   GFR: Estimated Creatinine Clearance: 58 mL/min (by C-G formula based on SCr of 1.09 mg/dL). Liver Function Tests: Recent Labs  Lab 04/19/21 2054  AST 24  ALT 19  ALKPHOS 110  BILITOT 0.8  PROT 7.2  ALBUMIN 3.6   No results for input(s): LIPASE, AMYLASE in the last 168 hours. No results for input(s): AMMONIA in the last 168 hours. Coagulation Profile: No results for input(s): INR, PROTIME in the last 168 hours. Cardiac Enzymes: No results for input(s): CKTOTAL,  CKMB, CKMBINDEX, TROPONINI in the last 168 hours. BNP (last 3 results) No results for input(s): PROBNP in the last 8760 hours. HbA1C: No results for input(s): HGBA1C in the last 72 hours. CBG: Recent Labs  Lab 04/20/21 0654  GLUCAP 181*   Lipid Profile: No results for input(s): CHOL, HDL, LDLCALC, TRIG, CHOLHDL, LDLDIRECT in the last 72 hours. Thyroid Function Tests: No results for input(s): TSH, T4TOTAL, FREET4, T3FREE, THYROIDAB in the last 72 hours. Anemia Panel: No results for input(s): VITAMINB12, FOLATE, FERRITIN, TIBC, IRON, RETICCTPCT in the last 72 hours. Urine analysis:    Component Value Date/Time   COLORURINE YELLOW 04/19/2021 0004   APPEARANCEUR CLEAR 04/19/2021 0004   LABSPEC 1.020 04/19/2021 0004   PHURINE 6.0 04/19/2021 0004   GLUCOSEU NEGATIVE 04/19/2021 0004   HGBUR MODERATE (A) 04/19/2021 0004   BILIRUBINUR NEGATIVE 04/19/2021 0004   KETONESUR NEGATIVE 04/19/2021 0004   PROTEINUR NEGATIVE 04/19/2021 0004   NITRITE NEGATIVE 04/19/2021 0004   LEUKOCYTESUR NEGATIVE 04/19/2021 0004   Sepsis Labs: @LABRCNTIP (procalcitonin:4,lacticidven:4)  ) Recent Results (from the past 240 hour(s))  Resp Panel by RT-PCR (Flu A&B, Covid) Nasopharyngeal Swab     Status: None   Collection Time: 04/19/21  8:54 PM   Specimen: Nasopharyngeal Swab; Nasopharyngeal(NP) swabs in vial transport medium  Result Value Ref Range Status   SARS Coronavirus 2 by RT PCR NEGATIVE NEGATIVE Final    Comment: (NOTE) SARS-CoV-2 target nucleic acids are NOT DETECTED.  The SARS-CoV-2 RNA is generally detectable in upper respiratory specimens during the acute phase of infection. The lowest concentration of SARS-CoV-2 viral copies this assay can detect is 138 copies/mL. A negative result does not preclude SARS-Cov-2 infection and should not be used as the sole basis for treatment or other patient management decisions. A negative result may occur with  improper specimen collection/handling,  submission of specimen other than nasopharyngeal swab, presence of viral mutation(s) within the areas targeted by this assay, and inadequate number of viral copies(<138 copies/mL). A negative result must be combined with clinical observations, patient history, and epidemiological information. The expected result is Negative.  Fact Sheet for Patients:  EntrepreneurPulse.com.au  Fact Sheet for Healthcare Providers:  IncredibleEmployment.be  This test is no t yet approved or cleared by the [place] FDA and  has been authorized for detection and/or diagnosis of SARS-CoV-2 by FDA under an Emergency Use Authorization (EUA). This EUA will remain  in effect (meaning this test can be used) for the duration of the COVID-19 declaration under Section 564(b)(1) of the Act, 21 U.S.C.section 360bbb-3(b)(1), unless the authorization is terminated  or revoked sooner.       Influenza A by PCR NEGATIVE NEGATIVE Final   Influenza B by PCR NEGATIVE NEGATIVE Final    Comment: (NOTE) The Xpert Xpress SARS-CoV-2/FLU/RSV plus assay is intended as an aid in the diagnosis of influenza from Nasopharyngeal swab specimens and should not be used as a sole  basis for treatment. Nasal washings and aspirates are unacceptable for Xpert Xpress SARS-CoV-2/FLU/RSV testing.  Fact Sheet for Patients: EntrepreneurPulse.com.au  Fact Sheet for Healthcare Providers: IncredibleEmployment.be  This test is not yet approved or cleared by the [place] FDA and has been authorized for detection and/or diagnosis of SARS-CoV-2 by FDA under an Emergency Use Authorization (EUA). This EUA will remain in effect (meaning this test can be used) for the duration of the COVID-19 declaration under Section 564(b)(1) of the Act, 21 U.S.C. section 360bbb-3(b)(1), unless the authorization is terminated or revoked.  Performed at [facility], [address] [place], [place] [address]          Radiology Studies: DG Chest Portable 1 View  Result Date: 04/19/2021 CLINICAL DATA:  Shortness of breath EXAM: PORTABLE CHEST 1 VIEW COMPARISON:  05/10/2013 FINDINGS: Check shadow is within normal limits. Aortic calcifications are again seen. Lungs are well aerated bilaterally. Diffuse opacity is noted throughout both lungs likely representing a mild degree of pulmonary edema. No sizable effusion is noted. No focal confluent infiltrate is seen. No bony abnormality is noted. IMPRESSION: Diffuse airspace opacity bilaterally likely representing mild pulmonary edema. No other focal abnormality is seen. Electronically Signed   By: Inez Catalina M.D.   On: 04/19/2021 21:12        Scheduled Meds:  enoxaparin (LOVENOX) injection  40 mg Subcutaneous Q24H   furosemide  40 mg Intravenous Q12H   insulin aspart  0-9 Units Subcutaneous TID AC & HS   Continuous Infusions:   LOS: 1 day    Time spent: 40 min    Desma Maxim, MD [facility]   If 7PM-7AM, please contact night-coverage [email or web address] Password [facility] 04/20/2021, 8:04 AM

## 2021-04-20 NOTE — ED Notes (Signed)
Lunch meal tray given at this time.  

## 2021-04-20 NOTE — Plan of Care (Signed)
°  Patient admitted to 1C room 101 at approximately 1600 this afternoon from the Emergency Department. Oriented to room and call bell, sitting up in chair, placed on cardiac monitoring and explained plan of care.   Problem: Education: Goal: Ability to demonstrate management of disease process will improve Outcome: Progressing   Problem: Activity: Goal: Capacity to carry out activities will improve Outcome: Progressing   Problem: Cardiac: Goal: Ability to achieve and maintain adequate cardiopulmonary perfusion will improve Outcome: Progressing   Problem: Education: Goal: Knowledge of General Education information will improve Description: Including pain rating scale, medication(s)/side effects and non-pharmacologic comfort measures Outcome: Progressing

## 2021-04-21 ENCOUNTER — Inpatient Hospital Stay
Admit: 2021-04-21 | Discharge: 2021-04-21 | Disposition: A | Discharge status: Home / Self Care | Payer: Medicare Other | Attending: Obstetrics and Gynecology | Admitting: Obstetrics and Gynecology

## 2021-04-21 LAB — ECHOCARDIOGRAM COMPLETE
AR max vel: 1.45 cm2
AV Peak grad: 6.6 mmHg
Ao pk vel: 1.28 m/s
Area-P 1/2: 2.99 cm2
Calc EF: 46.4 %
Height: 65 in
S' Lateral: 5.3 cm
Single Plane A2C EF: 43.3 %
Single Plane A4C EF: 46.6 %
Weight: 2391.55 oz

## 2021-04-21 LAB — BASIC METABOLIC PANEL
Anion gap: 7 (ref 5–15)
BUN: 31 mg/dL — ABNORMAL HIGH (ref 8–23)
CO2: 28 mmol/L (ref 22–32)
Calcium: 9 mg/dL (ref 8.9–10.3)
Chloride: 105 mmol/L (ref 98–111)
Creatinine, Ser: 1.11 mg/dL (ref 0.61–1.24)
GFR, Estimated: >60 mL/min (ref >60)
Glucose, Bld: 121 mg/dL — ABNORMAL HIGH (ref 70–99)
Potassium: 3.3 mmol/L — ABNORMAL LOW (ref 3.5–5.1)
Sodium: 140 mmol/L (ref 135–145)

## 2021-04-21 LAB — GLUCOSE, CAPILLARY: Glucose-Capillary: 113 mg/dL — ABNORMAL HIGH (ref 70–99)

## 2021-04-21 MED ORDER — POTASSIUM CHLORIDE CRYS ER 20 MEQ PO TBCR
40.0000 meq | EXTENDED_RELEASE_TABLET | ORAL | Status: DC
  Administered 2021-04-21: 40 meq via ORAL
  Filled 2021-04-21: qty 2

## 2021-04-21 MED ORDER — POTASSIUM CHLORIDE CRYS ER 20 MEQ PO TBCR: 20.0000 meq | EXTENDED_RELEASE_TABLET | Freq: Every day | ORAL | 0 refills | Status: AC

## 2021-04-21 MED ORDER — FUROSEMIDE 20 MG PO TABS: 20.0000 mg | ORAL_TABLET | Freq: Every day | ORAL | 3 refills | Status: AC

## 2021-04-21 MED ORDER — METFORMIN HCL 500 MG PO TABS: 500.0000 mg | ORAL_TABLET | Freq: Every day | ORAL | 1 refills | Status: AC

## 2021-04-21 MED ORDER — LISINOPRIL 20 MG PO TABS
40.0000 mg | ORAL_TABLET | Freq: Every day | ORAL | Status: DC
  Administered 2021-04-21: 40 mg via ORAL
  Filled 2021-04-21: qty 2

## 2021-04-21 NOTE — Progress Notes (Signed)
Patient OOF in stable condition.

## 2021-04-21 NOTE — Discharge Summary (Signed)
Physician Discharge Summary  ANAV LAMMERT FXT:[id number] DOB: 1955-10-11 DOA: 04/19/2021  PCP: Pcp, No  Admit date: 04/19/2021 Discharge date: 04/21/2021  Admitted From: Home  Disposition:  Home   Recommendations for Outpatient Follow-up:  Follow up with PCP in 1-2 weeks Please obtain BMP/CBC in one week Follow up with primary cardiology, consider Entresto.   Home Health: None  Discharge Condition: Stable.  CODE STATUS: Full code Diet recommendation: Heart Healthy   Brief/Interim Summary: 66 year old with past medical history significant for systolic and diastolic heart failure, diabetes type 2, hypertension, CAD who presents complaining of acute onset worsening shortness of breath associated with cough clear sputum and wheezing.  Lower extremity edema.  Admitted with acute on chronic systolic heart failure exacerbation.  He was diuresed with 40 mg of IV Lasix daily.  He feels improved.  Breathing at baseline.  Plan to discharge home today.    1-Acute respiratory distress secondary to heart failure exacerbation.  Patient required BiPAP on admission. Presented in respiratory distress requiring bipap initially. Now breathing comfortably on room air after diuresis Improved, oxygen saturation 100% on room air.  Did not require BiPAP last night.    2-Acute combined systolic/diastolic heart failure exacerbation.  In setting of one day missed home diuretic. Has responded to diuresis. CXR w/ pulmonary edema.  Troponins flat, no chest pain - Treated with lasix 40 iv qd - ECHO;  improved EF 45-50 %, piror 40--45 % - Continue with coreg -Resume lisinopril.  -Defer to primary cardiology consideration for entresto.  -Discharge on lasix daily 20 mg  -Weight: 151------ 149  HTN Hypertensive on arrival resolved w/ diuresis - cont diuresis, coreg, resume lisinopril.   CAD S/p stents. Followed by [facility]. - cont home prasugrel, atorva, zetia - says didn't tolerate sglt2i as outpt    T2DM Diet controlled per patient. Glucose here mildly elevated - f/u a1c 6.6 - daily fasting -start metformin at discharge. Further titration by PCP.   Hypokalemia: Replete orally   Discharge Diagnoses:  Principal Problem:   Acute CHF (congestive heart failure) (HCC) Active Problems:   CHF exacerbation (HCC)    Discharge Instructions   Allergies as of 04/21/2021       Reactions   Benzodiazepines Other (See Comments)   Unknown        Medication List     TAKE these medications    aspirin EC 81 MG tablet Take 81 mg by mouth daily.   atorvastatin 80 MG tablet Commonly known as: LIPITOR Take 80 mg by mouth daily.   carvedilol 12.5 MG tablet Commonly known as: COREG Take 12.5 mg by mouth 2 (two) times daily.   ezetimibe 10 MG tablet Commonly known as: ZETIA Take 10 mg by mouth daily.   furosemide 20 MG tablet Commonly known as: LASIX Take 1 tablet (20 mg total) by mouth daily. What changed: when to take this   lisinopril 40 MG tablet Commonly known as: ZESTRIL Take 40 mg by mouth daily.   loratadine 10 MG tablet Commonly known as: CLARITIN Take 10 mg by mouth daily as needed for allergies.   multivitamin with minerals tablet Take 1 tablet by mouth daily.   potassium chloride SA 20 MEQ tablet Commonly known as: KLOR-CON M Take 1 tablet (20 mEq total) by mouth daily for 2 days.   prasugrel 10 MG Tabs tablet Commonly known as: EFFIENT Take 10 mg by mouth daily.        Allergies  Allergen Reactions   Benzodiazepines Other (  See Comments)    Unknown    Consultations: None   Procedures/Studies: DG Chest Portable 1 View  Result Date: 04/19/2021 CLINICAL DATA:  Shortness of breath EXAM: PORTABLE CHEST 1 VIEW COMPARISON:  05/10/2013 FINDINGS: Check shadow is within normal limits. Aortic calcifications are again seen. Lungs are well aerated bilaterally. Diffuse opacity is noted throughout both lungs likely representing a mild degree of  pulmonary edema. No sizable effusion is noted. No focal confluent infiltrate is seen. No bony abnormality is noted. IMPRESSION: Diffuse airspace opacity bilaterally likely representing mild pulmonary edema. No other focal abnormality is seen. Electronically Signed   By: Alcide CleverMark  Lukens M.D.   On: 04/19/2021 21:12   ECHOCARDIOGRAM COMPLETE  Result Date: 04/21/2021    ECHOCARDIOGRAM REPORT   Patient Name:   Richard GaussRICHARD L [name] Date of Exam: 04/21/2021 Medical Rec #:  [id number]       Height:       65.0 in Accession #:    [id number]      Weight:       149.5 lb Date of Birth:  [birth year]      BSA:          1.748 m Patient Age:    65 years        BP:           124/68 mmHg Patient Gender: M               HR:           72 bpm. Exam Location:  [facility] Procedure: 2D Echo Indications:     Diastolic CHF I50.31  History:         Patient has no prior history of Echocardiogram examinations.                  CHF, CAD; Risk Factors:Diabetes, Former Smoker and                  Hypertension.  Sonographer:     L Thornton-Maynard Referring Phys:  [id number] NOAH BEDFORD Valen.DockerWOUK Diagnosing Phys: Sena Slateyan Orgel IMPRESSIONS  1. Left ventricular ejection fraction, by estimation, is 45 to 50%. The left ventricle has mildly decreased function. The left ventricle has no regional wall motion abnormalities. Left ventricular diastolic parameters are consistent with Grade I diastolic dysfunction (impaired relaxation).  2. Right ventricular systolic function is normal. The right ventricular size is normal.  3. Left atrial size was mildly dilated.  4. The mitral valve is degenerative. Mild mitral valve regurgitation. No evidence of mitral stenosis.  5. The aortic valve is grossly normal. Aortic valve regurgitation is not visualized. Aortic valve sclerosis/calcification is present, without any evidence of aortic stenosis.  6. The inferior vena cava is normal in size with greater than 50% respiratory variability, suggesting right atrial pressure of 3 mmHg. FINDINGS   Left Ventricle: Left ventricular ejection fraction, by estimation, is 45 to 50%. The left ventricle has mildly decreased function. The left ventricle has no regional wall motion abnormalities. The left ventricular internal cavity size was normal in size. There is no left ventricular hypertrophy. Left ventricular diastolic parameters are consistent with Grade I diastolic dysfunction (impaired relaxation). Right Ventricle: The right ventricular size is normal. No increase in right ventricular wall thickness. Right ventricular systolic function is normal. Left Atrium: Left atrial size was mildly dilated. Right Atrium: Right atrial size was normal in size. Pericardium: There is no evidence of pericardial effusion. Mitral Valve: The mitral valve is degenerative in appearance. Mild mitral  valve regurgitation. No evidence of mitral valve stenosis. Tricuspid Valve: The tricuspid valve is normal in structure. Tricuspid valve regurgitation is not demonstrated. Aortic Valve: The aortic valve is grossly normal. Aortic valve regurgitation is not visualized. Aortic valve sclerosis/calcification is present, without any evidence of aortic stenosis. Aortic valve peak gradient measures 6.6 mmHg. Pulmonic Valve: The pulmonic valve was not well visualized. Aorta: The aortic root is normal in size and structure. Venous: The inferior vena cava is normal in size with greater than 50% respiratory variability, suggesting right atrial pressure of 3 mmHg. IAS/Shunts: No atrial level shunt detected by color flow Doppler.  LEFT VENTRICLE PLAX 2D LVIDd:         5.70 cm     Diastology LVIDs:         5.30 cm     LV e' medial:    5.87 cm/s LV PW:         0.70 cm     LV E/e' medial:  12.8 LV IVS:        0.80 cm     LV e' lateral:   6.74 cm/s LVOT diam:     1.80 cm     LV E/e' lateral: 11.1 LV SV:         37 LV SV Index:   21 LVOT Area:     2.54 cm  LV Volumes (MOD) LV vol d, MOD A2C: 96.3 ml LV vol d, MOD A4C: 81.6 ml LV vol s, MOD A2C: 54.6 ml LV  vol s, MOD A4C: 43.6 ml LV SV MOD A2C:     41.7 ml LV SV MOD A4C:     81.6 ml LV SV MOD BP:      42.7 ml RIGHT VENTRICLE             IVC RV S prime:     13.60 cm/s  IVC diam: 1.80 cm TAPSE (M-mode): 1.8 cm LEFT ATRIUM             Index LA diam:        3.30 cm 1.89 cm/m LA Vol (A2C):   58.9 ml 33.70 ml/m LA Vol (A4C):   66.8 ml 38.22 ml/m LA Biplane Vol: 63.5 ml 36.33 ml/m  AORTIC VALVE                 PULMONIC VALVE AV Area (Vmax): 1.45 cm     PV Vmax:       0.97 m/s AV Vmax:        128.00 cm/s  PV Peak grad:  3.7 mmHg AV Peak Grad:   6.6 mmHg LVOT Vmax:      73.10 cm/s LVOT Vmean:     55.600 cm/s LVOT VTI:       0.145 m  AORTA Ao Root diam: 3.00 cm MITRAL VALVE MV Area (PHT): 2.99 cm    SHUNTS MV Decel Time: 254 msec    Systemic VTI:  0.14 m MV E velocity: 75.00 cm/s  Systemic Diam: 1.80 cm MV A velocity: 81.00 cm/s MV E/A ratio:  0.93 MV A Prime:    5.3 cm/s Sena Slateyan Orgel Electronically signed by Sena Slateyan Orgel Signature Date/Time: 04/21/2021/1:08:39 PM    Final      Subjective: He is breathing better, back at baseline.  Discharge Exam: Vitals:   04/21/21 0731 04/21/21 1114  BP: 136/84 129/67  Pulse: 69 65  Resp: 16 16  Temp: 98.2 F (36.8 C) (!) 97.5 F (36.4 C)  SpO2: 95% 95%  General: Pt is alert, awake, not in acute distress Cardiovascular: RRR, S1/S2 +, no rubs, no gallops Respiratory: CTA bilaterally, no wheezing, no rhonchi Abdominal: Soft, NT, ND, bowel sounds + Extremities: no edema, no cyanosis    The results of significant diagnostics from this hospitalization (including imaging, microbiology, ancillary and laboratory) are listed below for reference.     Microbiology: Recent Results (from the past 240 hour(s))  Resp Panel by RT-PCR (Flu A&B, Covid) Nasopharyngeal Swab     Status: None   Collection Time: 04/19/21  8:54 PM   Specimen: Nasopharyngeal Swab; Nasopharyngeal(NP) swabs in vial transport medium  Result Value Ref Range Status   SARS Coronavirus 2 by RT PCR  NEGATIVE NEGATIVE Final    Comment: (NOTE) SARS-CoV-2 target nucleic acids are NOT DETECTED.  The SARS-CoV-2 RNA is generally detectable in upper respiratory specimens during the acute phase of infection. The lowest concentration of SARS-CoV-2 viral copies this assay can detect is 138 copies/mL. A negative result does not preclude SARS-Cov-2 infection and should not be used as the sole basis for treatment or other patient management decisions. A negative result may occur with  improper specimen collection/handling, submission of specimen other than nasopharyngeal swab, presence of viral mutation(s) within the areas targeted by this assay, and inadequate number of viral copies(<138 copies/mL). A negative result must be combined with clinical observations, patient history, and epidemiological information. The expected result is Negative.  Fact Sheet for Patients:  BloggerCourse.com  Fact Sheet for Healthcare Providers:  SeriousBroker.it  This test is no t yet approved or cleared by the [place] FDA and  has been authorized for detection and/or diagnosis of SARS-CoV-2 by FDA under an Emergency Use Authorization (EUA). This EUA will remain  in effect (meaning this test can be used) for the duration of the COVID-19 declaration under Section 564(b)(1) of the Act, 21 U.S.C.section 360bbb-3(b)(1), unless the authorization is terminated  or revoked sooner.       Influenza A by PCR NEGATIVE NEGATIVE Final   Influenza B by PCR NEGATIVE NEGATIVE Final    Comment: (NOTE) The Xpert Xpress SARS-CoV-2/FLU/RSV plus assay is intended as an aid in the diagnosis of influenza from Nasopharyngeal swab specimens and should not be used as a sole basis for treatment. Nasal washings and aspirates are unacceptable for Xpert Xpress SARS-CoV-2/FLU/RSV testing.  Fact Sheet for Patients: BloggerCourse.com  Fact Sheet for  Healthcare Providers: SeriousBroker.it  This test is not yet approved or cleared by the [place] FDA and has been authorized for detection and/or diagnosis of SARS-CoV-2 by FDA under an Emergency Use Authorization (EUA). This EUA will remain in effect (meaning this test can be used) for the duration of the COVID-19 declaration under Section 564(b)(1) of the Act, 21 U.S.C. section 360bbb-3(b)(1), unless the authorization is terminated or revoked.  Performed at [facility], [address] [place], [place] [address]      Labs: BNP (last 3 results) Recent Labs    04/19/21 0004  BNP 241.4*   Basic Metabolic Panel: Recent Labs  Lab 04/19/21 2054 04/20/21 0505 04/21/21 0329  NA 140 137 140  K 4.4 3.7 3.3*  CL 105 103 105  CO2 27 27 28   GLUCOSE 222* 207* 121*  BUN 16 18 31*  CREATININE 1.25* 1.09 1.11  CALCIUM 9.2 8.6* 9.0   Liver Function Tests: Recent Labs  Lab 04/19/21 2054  AST 24  ALT 19  ALKPHOS 110  BILITOT 0.8  PROT 7.2  ALBUMIN 3.6  No results for input(s): LIPASE, AMYLASE in the last 168 hours. No results for input(s): AMMONIA in the last 168 hours. CBC: Recent Labs  Lab 04/19/21 2054 04/20/21 0505  WBC 10.9* 7.4  NEUTROABS 7.4  --   HGB 14.2 13.0  HCT 45.9 41.4  MCV 89.3 87.9  PLT 264 202   Cardiac Enzymes: No results for input(s): CKTOTAL, CKMB, CKMBINDEX, TROPONINI in the last 168 hours. BNP: Invalid input(s): POCBNP CBG: Recent Labs  Lab 04/20/21 0654 04/20/21 1152 04/21/21 0417  GLUCAP 181* 246* 113*   D-Dimer No results for input(s): DDIMER in the last 72 hours. Hgb A1c Recent Labs    04/19/21 0004  HGBA1C 6.6*   Lipid Profile No results for input(s): CHOL, HDL, LDLCALC, TRIG, CHOLHDL, LDLDIRECT in the last 72 hours. Thyroid function studies No results for input(s): TSH, T4TOTAL, T3FREE, THYROIDAB in the last 72 hours.  Invalid input(s): FREET3 Anemia work up No results for  input(s): VITAMINB12, FOLATE, FERRITIN, TIBC, IRON, RETICCTPCT in the last 72 hours. Urinalysis    Component Value Date/Time   COLORURINE YELLOW 04/19/2021 0004   APPEARANCEUR CLEAR 04/19/2021 0004   LABSPEC 1.020 04/19/2021 0004   PHURINE 6.0 04/19/2021 0004   GLUCOSEU NEGATIVE 04/19/2021 0004   HGBUR MODERATE (A) 04/19/2021 0004   BILIRUBINUR NEGATIVE 04/19/2021 0004   KETONESUR NEGATIVE 04/19/2021 0004   PROTEINUR NEGATIVE 04/19/2021 0004   NITRITE NEGATIVE 04/19/2021 0004   LEUKOCYTESUR NEGATIVE 04/19/2021 0004   Sepsis Labs Invalid input(s): PROCALCITONIN,  WBC,  LACTICIDVEN Microbiology Recent Results (from the past 240 hour(s))  Resp Panel by RT-PCR (Flu A&B, Covid) Nasopharyngeal Swab     Status: None   Collection Time: 04/19/21  8:54 PM   Specimen: Nasopharyngeal Swab; Nasopharyngeal(NP) swabs in vial transport medium  Result Value Ref Range Status   SARS Coronavirus 2 by RT PCR NEGATIVE NEGATIVE Final    Comment: (NOTE) SARS-CoV-2 target nucleic acids are NOT DETECTED.  The SARS-CoV-2 RNA is generally detectable in upper respiratory specimens during the acute phase of infection. The lowest concentration of SARS-CoV-2 viral copies this assay can detect is 138 copies/mL. A negative result does not preclude SARS-Cov-2 infection and should not be used as the sole basis for treatment or other patient management decisions. A negative result may occur with  improper specimen collection/handling, submission of specimen other than nasopharyngeal swab, presence of viral mutation(s) within the areas targeted by this assay, and inadequate number of viral copies(<138 copies/mL). A negative result must be combined with clinical observations, patient history, and epidemiological information. The expected result is Negative.  Fact Sheet for Patients:  BloggerCourse.com  Fact Sheet for Healthcare Providers:   SeriousBroker.it  This test is no t yet approved or cleared by the [place] FDA and  has been authorized for detection and/or diagnosis of SARS-CoV-2 by FDA under an Emergency Use Authorization (EUA). This EUA will remain  in effect (meaning this test can be used) for the duration of the COVID-19 declaration under Section 564(b)(1) of the Act, 21 U.S.C.section 360bbb-3(b)(1), unless the authorization is terminated  or revoked sooner.       Influenza A by PCR NEGATIVE NEGATIVE Final   Influenza B by PCR NEGATIVE NEGATIVE Final    Comment: (NOTE) The Xpert Xpress SARS-CoV-2/FLU/RSV plus assay is intended as an aid in the diagnosis of influenza from Nasopharyngeal swab specimens and should not be used as a sole basis for treatment. Nasal washings and aspirates are unacceptable for Xpert Xpress SARS-CoV-2/FLU/RSV testing.  Fact Sheet for Patients: BloggerCourse.com  Fact Sheet for Healthcare Providers: SeriousBroker.it  This test is not yet approved or cleared by the [place] FDA and has been authorized for detection and/or diagnosis of SARS-CoV-2 by FDA under an Emergency Use Authorization (EUA). This EUA will remain in effect (meaning this test can be used) for the duration of the COVID-19 declaration under Section 564(b)(1) of the Act, 21 U.S.C. section 360bbb-3(b)(1), unless the authorization is terminated or revoked.  Performed at [facility], [address], [place], [place] [address]      Time coordinating discharge: 40 minutes  SIGNED:   Alba Cory, MD  [facility]

## 2021-04-21 NOTE — Progress Notes (Signed)
Patient verbalized understanding of all discharge instructions.  

## 2021-04-30 ENCOUNTER — Ambulatory Visit: Payer: Medicare Other | Admitting: Family

## 2021-04-30 ENCOUNTER — Telehealth: Payer: Self-pay | Admitting: Family

## 2021-04-30 NOTE — Telephone Encounter (Signed)
Unable to Reach patient in attempt to reschedule his missed new patient appointment and unable to lvm. I attempted to call patients work, home, cell phone as well as his spouse.   Zerline Melchior, NT
